# Patient Record
Sex: Male | Born: 1945 | Hispanic: No | Marital: Married | Smoking: Former smoker
Health system: Southern US, Community
[De-identification: ages and names within clinical notes are randomized; demographics above are authoritative.]

## PROBLEM LIST (undated history)

## (undated) DIAGNOSIS — M542 Cervicalgia: Secondary | ICD-10-CM

## (undated) DIAGNOSIS — I10 Essential (primary) hypertension: Secondary | ICD-10-CM

## (undated) DIAGNOSIS — G894 Chronic pain syndrome: Secondary | ICD-10-CM

## (undated) DIAGNOSIS — IMO0002 Reserved for concepts with insufficient information to code with codable children: Secondary | ICD-10-CM

## (undated) DIAGNOSIS — N529 Male erectile dysfunction, unspecified: Secondary | ICD-10-CM

## (undated) DIAGNOSIS — E785 Hyperlipidemia, unspecified: Secondary | ICD-10-CM

## (undated) DIAGNOSIS — K7689 Other specified diseases of liver: Secondary | ICD-10-CM

## (undated) DIAGNOSIS — K4090 Unilateral inguinal hernia, without obstruction or gangrene, not specified as recurrent: Secondary | ICD-10-CM

## (undated) DIAGNOSIS — K648 Other hemorrhoids: Secondary | ICD-10-CM

## (undated) DIAGNOSIS — G629 Polyneuropathy, unspecified: Secondary | ICD-10-CM

## (undated) DIAGNOSIS — H53009 Unspecified amblyopia, unspecified eye: Secondary | ICD-10-CM

## (undated) DIAGNOSIS — I639 Cerebral infarction, unspecified: Secondary | ICD-10-CM

## (undated) DIAGNOSIS — K5900 Constipation, unspecified: Secondary | ICD-10-CM

## (undated) DIAGNOSIS — K219 Gastro-esophageal reflux disease without esophagitis: Secondary | ICD-10-CM

## (undated) DIAGNOSIS — N433 Hydrocele, unspecified: Secondary | ICD-10-CM

## (undated) DIAGNOSIS — R42 Dizziness and giddiness: Secondary | ICD-10-CM

## (undated) DIAGNOSIS — K828 Other specified diseases of gallbladder: Secondary | ICD-10-CM

## (undated) DIAGNOSIS — G479 Sleep disorder, unspecified: Secondary | ICD-10-CM

## (undated) DIAGNOSIS — M549 Dorsalgia, unspecified: Secondary | ICD-10-CM

## (undated) DIAGNOSIS — H409 Unspecified glaucoma: Secondary | ICD-10-CM

## (undated) HISTORY — PX: LOBECTOMY: SHX5089

## (undated) HISTORY — DX: Other specified diseases of liver: K76.89

## (undated) HISTORY — DX: Other hemorrhoids: K64.8

## (undated) HISTORY — PX: NECK SURGERY: SHX720

## (undated) HISTORY — DX: Cerebral infarction, unspecified: I63.9

## (undated) HISTORY — DX: Gastro-esophageal reflux disease without esophagitis: K21.9

## (undated) HISTORY — PX: HEMORRHOID SURGERY: SHX153

## (undated) HISTORY — DX: Hydrocele, unspecified: N43.3

## (undated) HISTORY — DX: Chronic pain syndrome: G89.4

## (undated) HISTORY — DX: Unilateral inguinal hernia, without obstruction or gangrene, not specified as recurrent: K40.90

## (undated) HISTORY — DX: Essential (primary) hypertension: I10

## (undated) HISTORY — PX: LUMBAR DISC SURGERY: SHX700

## (undated) HISTORY — DX: Other specified diseases of gallbladder: K82.8

## (undated) HISTORY — DX: Sleep disorder, unspecified: G47.9

## (undated) HISTORY — DX: Reserved for concepts with insufficient information to code with codable children: IMO0002

## (undated) HISTORY — PX: COLONOSCOPY: SHX174

## (undated) HISTORY — PX: OTHER SURGICAL HISTORY: SHX169

## (undated) HISTORY — DX: Hyperlipidemia, unspecified: E78.5

## (undated) HISTORY — PX: CERVICAL DISC SURGERY: SHX588

## (undated) HISTORY — DX: Unspecified amblyopia, unspecified eye: H53.009

## (undated) HISTORY — DX: Polyneuropathy, unspecified: G62.9

## (undated) HISTORY — PX: BACK SURGERY: SHX140

## (undated) HISTORY — DX: Male erectile dysfunction, unspecified: N52.9

---

## 1998-01-11 ENCOUNTER — Ambulatory Visit (HOSPITAL_COMMUNITY): Admission: RE | Admit: 1998-01-11 | Discharge: 1998-01-11 | Payer: Self-pay | Admitting: Neurosurgery

## 1998-02-18 ENCOUNTER — Ambulatory Visit (HOSPITAL_COMMUNITY): Admission: RE | Admit: 1998-02-18 | Discharge: 1998-02-18 | Payer: Self-pay | Admitting: Neurosurgery

## 1998-02-21 ENCOUNTER — Inpatient Hospital Stay (HOSPITAL_COMMUNITY): Admission: RE | Admit: 1998-02-21 | Discharge: 1998-02-23 | Payer: Self-pay | Admitting: Neurosurgery

## 2004-04-20 ENCOUNTER — Ambulatory Visit: Payer: Self-pay | Admitting: Internal Medicine

## 2004-08-31 ENCOUNTER — Ambulatory Visit: Payer: Self-pay | Admitting: Family Medicine

## 2004-09-16 ENCOUNTER — Emergency Department (HOSPITAL_COMMUNITY): Admission: EM | Admit: 2004-09-16 | Discharge: 2004-09-16 | Payer: Self-pay | Admitting: Emergency Medicine

## 2004-10-13 ENCOUNTER — Ambulatory Visit (HOSPITAL_COMMUNITY): Admission: RE | Admit: 2004-10-13 | Discharge: 2004-10-13 | Payer: Self-pay | Admitting: Neurosurgery

## 2004-10-27 ENCOUNTER — Encounter: Admission: RE | Admit: 2004-10-27 | Discharge: 2004-10-27 | Payer: Self-pay | Admitting: Neurosurgery

## 2004-11-12 ENCOUNTER — Encounter: Admission: RE | Admit: 2004-11-12 | Discharge: 2004-11-12 | Payer: Self-pay | Admitting: Neurosurgery

## 2004-11-26 ENCOUNTER — Encounter: Admission: RE | Admit: 2004-11-26 | Discharge: 2004-11-26 | Payer: Self-pay | Admitting: Neurosurgery

## 2005-03-31 ENCOUNTER — Encounter: Admission: RE | Admit: 2005-03-31 | Discharge: 2005-03-31 | Payer: Self-pay | Admitting: Neurosurgery

## 2005-04-21 ENCOUNTER — Ambulatory Visit: Payer: Self-pay | Admitting: Family Medicine

## 2005-04-26 ENCOUNTER — Ambulatory Visit: Payer: Self-pay | Admitting: Family Medicine

## 2005-05-03 ENCOUNTER — Emergency Department (HOSPITAL_COMMUNITY): Admission: EM | Admit: 2005-05-03 | Discharge: 2005-05-03 | Payer: Self-pay | Admitting: Emergency Medicine

## 2005-05-12 ENCOUNTER — Inpatient Hospital Stay (HOSPITAL_COMMUNITY): Admission: AD | Admit: 2005-05-12 | Discharge: 2005-05-17 | Payer: Self-pay | Admitting: Neurosurgery

## 2005-10-06 ENCOUNTER — Encounter: Payer: Self-pay | Admitting: Neurosurgery

## 2005-10-27 IMAGING — CT CT CERVICAL SPINE W/ CM
3 of 25 series · 6 of 39 positions shown, 7 images · IV contrast (omnipaque)
Comparison: none

CLINICAL DATA: Neck and leg pain.  Clinical suspicion for L5-S1 radiculopathy on the right and C6 or C7 radiculopathy on the right (neck, right upper extremity, and right leg).  Prior plate and screw fixation at C5-6 and C6-7.
CERVICAL SPINE AND LUMBAR MYELOGRAPHY ? 10/13/04:
Dr. Denyse Pao instilled 10 cc of Omnipaque 300 into the subarachnoid space at the L5-S1 level.

[Series 2: cervical spine · axial · 0.26mm/px · z∈[-250,-250]mm · 1 of 81 slices shown, 2 images]
[im 1/81  soft-tissue]
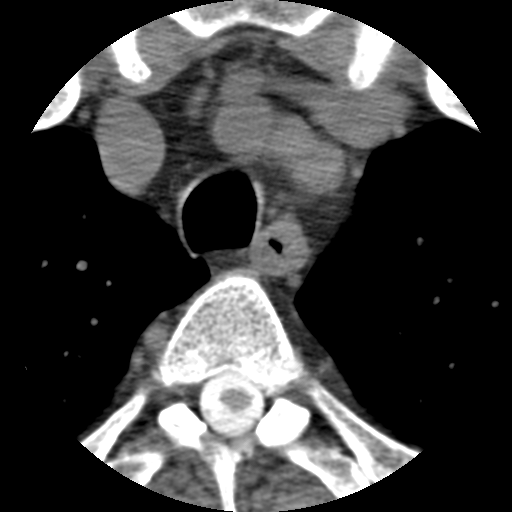
[im 1/81  bone]
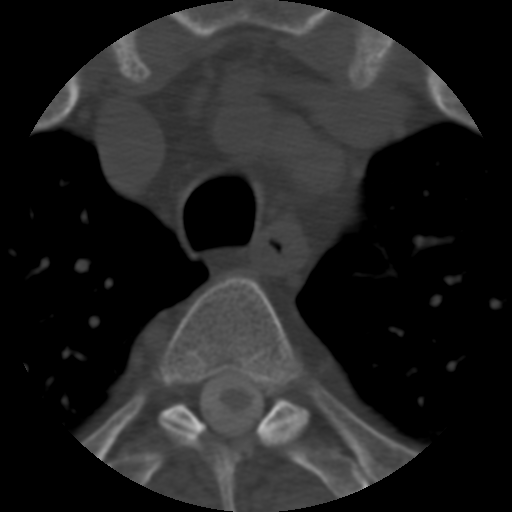

[Series 105: reformatted · coronal · 0.42mm/px · 1 of 49 slices shown (1 of 2)]
[im 25/49  bone]
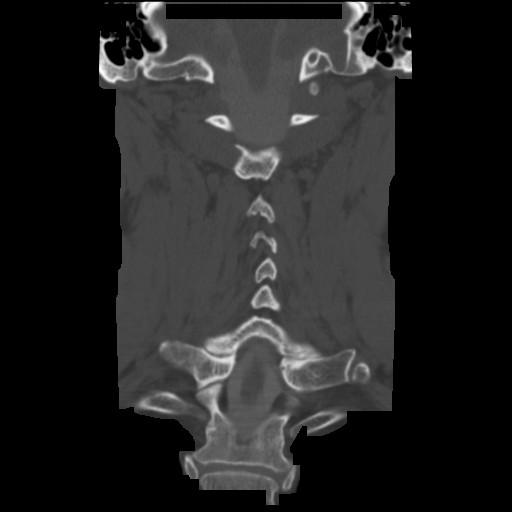

[Series 106: reformatted · sagittal · 0.62mm/px · 4 of 54 slices shown (2 of 2)]
[im 11/54  bone]
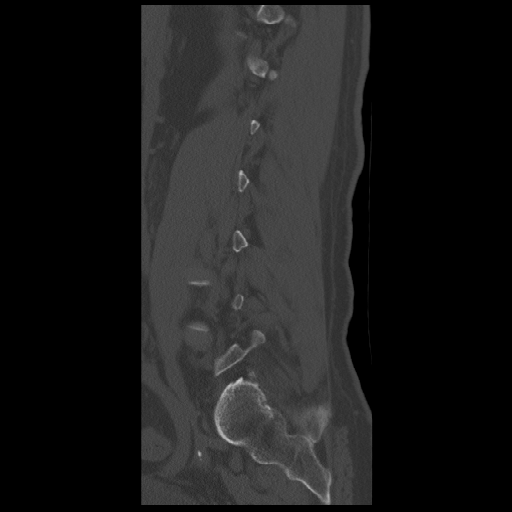
[im 22/54  bone]
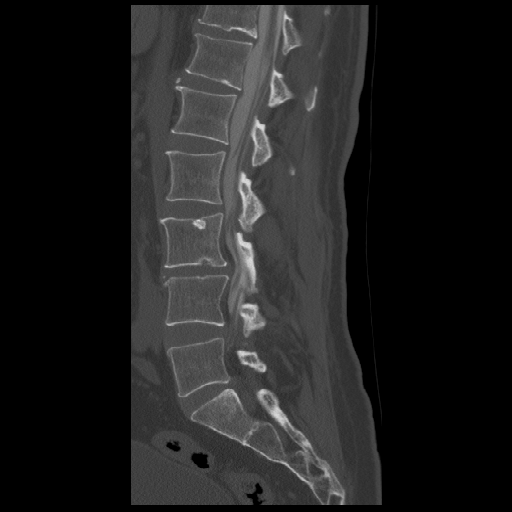
[im 32/54  bone]
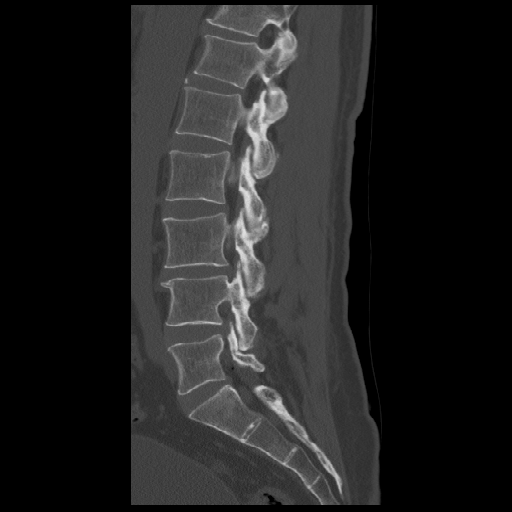
[im 43/54  bone]
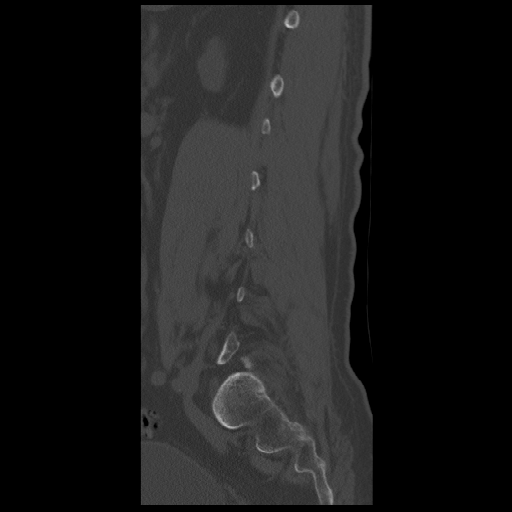

[6 of 39 positions shown; findings below may reference images not displayed]

FINDINGS: Extradural defects at L5-S1 bilaterally, mainly on the left, with mass effect on particularly the left L5 nerve root.  Prominent anterior extradural defect at L3-4 at the site of disk space narrowing and prominent osteophytes, particularly posteriorly.  Minimal anterolisthesis of L3 on L4 by 2-3 mm.  Also prominent extradural defect at L1-2 on the lateral view.  Addition to the extradural defects at L4-5, query mass effect on the S1 nerve roots, right greater than left.  
Plate and screw fixation hardware appears intact as does the interbody fusion at C5-6 and C6-7.  Mild anterior extradural defects at C3-4 and C4-5 due to spondylotic changes.
IMPRESSION: Stable appearance of plate and screw fixation and interbody fusion from C5 to C7.  
Anterior extradural defects in the cervical and lumbar regions as described above.
Prominent extradural defects at L4-5 and to a lesser degree at L5-S1.  
Post-myelogram CT to follow.
POST-MYELOGRAM CT SCAN OF THE CERVICAL SPINE WITH CONTRAST:
Following the myelogram, transaxial cuts were acquired from the occiput to T3 level.  From the axial data set, images were reconstructed in the coronal and sagittal planes.
FINDINGS: Solid appearing interbody fusions at C5-6 and C6-7.  Unremarkable appearance of anterior plate and screw fixation at C5, C6, and C7.  No unusual bony rarefaction or about the screws.  Degenerative disk disease changes at C4-5.  Anterior osteophytic formation and calcified annulus at C7-T1 anteriorly.  Foraminal stenosis on the right at C3-4 secondary to uncinate process and facet hypertrophy.  Similar etiology for foraminal stenosis on the right at C4-5 and on the left at C5-6, and to a lesser degree on the left at C6-7.  Small hemilaminotomy defect I believe on the left at C5-6.  Mild focal hypertrophic bony changes midline and posterolateral left at C5-6.  Diffusely degenerative fact arthropathic changes with facet hypertrophy, mainly on the right at C3-4 and C4-5.
Suspicion for HNP posterolateral on the right at C3-4 possibly affecting the right C4 nerve root.
IMPRESSION: Multiple sites of foraminal stenosis as described above.  
Suspicion for HNP in the lateral recess on the right at C3-4.
POST-MYELOGRAM CT SCAN OF THE LUMBAR SPINE WITH CONTRAST (Intrathecal Omnipaque 300):
Transaxial cuts were acquired from T12 to S1 and from the axial data set images were reconstructed in coronal and sagittal planes.
FINDINGS: Eccentric annular bulging posterolaterally on the left at L1-2.  Mild central stenosis at that level.  
At L2-3, there is mild annular bulging mainly on the left posterolaterally and laterally with encroachment on the left L2 nerve root.  
Mild to moderate stenosis of the central canal at L2-3.  
At L3-4, there is minimal anterolisthesis of L3 on L4 and mild focal kyphosis.  Broad based shallow disk herniation paracentral and posterolateral on the right at L3-4 encroaching on the right L3 but particularly the right L4 nerve roots.  Laminectomy defect at L3-4.
At L4-5, there is diffuse annular bulging and hypertrophy of facets and ligamentum flavum leading to marked stenosis of the central canal and lateral recesses.  Compression on the foramina, particularly on the left with a broad based HNP mainly posterolateral and lateral on the left.  Encroachment on the left 4 nerve root within and just lateral to the foramen. 
Advanced arthropathic changes.  
At L5-S1, there is an HNP paracentral and slightly posterolateral on the right with mass effect on the right S1 nerve root.  There may be a free disk fragment extending downward into the right S1 lateral recess.
IMPRESSION: HNP on the right at L5-S1.
Stenotic changes at L4-5 and an HNP posterolateral on the left at L4-5.
Moderately small shallow HNP central, paracentral, and posterolateral on the right at L3-4. 
Eccentric annular bulge/shallow subligamentous protrusion lateral on the left at L2-3.
Shallow eccentric bulge posterolateral/lateral on the left at L1-2.
Minimal posterolateral HNP on the right at T12-L1.

## 2006-01-18 ENCOUNTER — Ambulatory Visit: Payer: Self-pay | Admitting: Family Medicine

## 2006-02-02 ENCOUNTER — Encounter: Admission: RE | Admit: 2006-02-02 | Discharge: 2006-02-02 | Payer: Self-pay | Admitting: Neurosurgery

## 2006-02-10 ENCOUNTER — Ambulatory Visit: Payer: Self-pay | Admitting: Family Medicine

## 2006-03-30 ENCOUNTER — Ambulatory Visit (HOSPITAL_COMMUNITY): Admission: RE | Admit: 2006-03-30 | Discharge: 2006-03-30 | Payer: Self-pay | Admitting: Infectious Diseases

## 2006-04-22 ENCOUNTER — Ambulatory Visit: Payer: Self-pay | Admitting: Family Medicine

## 2006-04-22 LAB — CONVERTED CEMR LAB
AST: 23 units/L (ref 0–37)
Albumin: 3.7 g/dL (ref 3.5–5.2)
Alkaline Phosphatase: 62 units/L (ref 39–117)
BUN: 10 mg/dL (ref 6–23)
Calcium: 9.2 mg/dL (ref 8.4–10.5)
Chol/HDL Ratio, serum: 2.8
Cholesterol: 141 mg/dL (ref 0–200)
Creatinine, Ser: 1.1 mg/dL (ref 0.4–1.5)
Eosinophil percent: 1.8 % (ref 0.0–5.0)
HCT: 44.1 % (ref 39.0–52.0)
Hemoglobin: 15.1 g/dL (ref 13.0–17.0)
Lymphocytes Relative: 26 % (ref 12.0–46.0)
Neutrophils Relative %: 65.6 % (ref 43.0–77.0)
PSA: 1.18 ng/mL (ref 0.10–4.00)
Potassium: 4.1 meq/L (ref 3.5–5.1)
RDW: 12.3 % (ref 11.5–14.6)
TSH: 2.96 microintl units/mL (ref 0.35–5.50)
Total Bilirubin: 1.5 mg/dL — ABNORMAL HIGH (ref 0.3–1.2)
Total Protein: 6.8 g/dL (ref 6.0–8.3)
Triglyceride fasting, serum: 72 mg/dL (ref 0–149)

## 2006-05-06 ENCOUNTER — Ambulatory Visit: Payer: Self-pay | Admitting: Family Medicine

## 2006-05-11 ENCOUNTER — Ambulatory Visit: Payer: Self-pay

## 2006-05-13 ENCOUNTER — Ambulatory Visit: Payer: Self-pay | Admitting: Family Medicine

## 2006-11-29 ENCOUNTER — Ambulatory Visit: Payer: Self-pay | Admitting: Family Medicine

## 2007-03-07 DIAGNOSIS — I10 Essential (primary) hypertension: Secondary | ICD-10-CM

## 2007-04-24 ENCOUNTER — Ambulatory Visit: Payer: Self-pay | Admitting: Family Medicine

## 2007-04-24 LAB — CONVERTED CEMR LAB
ALT: 31 units/L (ref 0–53)
Bilirubin Urine: NEGATIVE
Bilirubin, Direct: 0.2 mg/dL (ref 0.0–0.3)
Calcium: 9.1 mg/dL (ref 8.4–10.5)
Cholesterol: 138 mg/dL (ref 0–200)
Eosinophils Absolute: 0.1 10*3/uL (ref 0.0–0.6)
Eosinophils Relative: 2.1 % (ref 0.0–5.0)
GFR calc Af Amer: 97 mL/min
GFR calc non Af Amer: 80 mL/min
Glucose, Bld: 92 mg/dL (ref 70–99)
Glucose, Urine, Semiquant: NEGATIVE
HDL: 41.5 mg/dL (ref >39.0)
Ketones, urine, test strip: NEGATIVE
Lymphocytes Relative: 27.6 % (ref 12.0–46.0)
MCHC: 34.4 g/dL (ref 30.0–36.0)
MCV: 91.6 fL (ref 78.0–100.0)
Neutro Abs: 3.5 10*3/uL (ref 1.4–7.7)
PSA: 1.21 ng/mL (ref 0.10–4.00)
Platelets: 133 10*3/uL — ABNORMAL LOW (ref 150–400)
Sodium: 142 meq/L (ref 135–145)
Specific Gravity, Urine: 1.02
Triglycerides: 86 mg/dL (ref 0–149)
WBC: 5.5 10*3/uL (ref 4.5–10.5)
pH: 6

## 2007-05-01 ENCOUNTER — Ambulatory Visit: Payer: Self-pay | Admitting: Family Medicine

## 2007-05-01 DIAGNOSIS — E782 Mixed hyperlipidemia: Secondary | ICD-10-CM

## 2007-05-01 DIAGNOSIS — M549 Dorsalgia, unspecified: Secondary | ICD-10-CM | POA: Insufficient documentation

## 2007-05-01 DIAGNOSIS — F528 Other sexual dysfunction not due to a substance or known physiological condition: Secondary | ICD-10-CM

## 2007-05-09 ENCOUNTER — Encounter: Payer: Self-pay | Admitting: Family Medicine

## 2007-05-10 ENCOUNTER — Telehealth: Payer: Self-pay | Admitting: Family Medicine

## 2007-06-14 ENCOUNTER — Telehealth: Payer: Self-pay | Admitting: Family Medicine

## 2007-10-24 ENCOUNTER — Ambulatory Visit: Payer: Self-pay | Admitting: Family Medicine

## 2007-10-24 LAB — CONVERTED CEMR LAB
ALT: 45 units/L (ref 0–53)
AST: 26 units/L (ref 0–37)
Albumin: 4 g/dL (ref 3.5–5.2)
BUN: 11 mg/dL (ref 6–23)
Basophils Relative: 0.3 % (ref 0.0–1.0)
CO2: 32 meq/L (ref 19–32)
Chloride: 108 meq/L (ref 96–112)
Creatinine, Ser: 1 mg/dL (ref 0.4–1.5)
Eosinophils Relative: 0.8 % (ref 0.0–5.0)
H Pylori IgG: NEGATIVE
Lymphocytes Relative: 16.8 % (ref 12.0–46.0)
Neutrophils Relative %: 76.5 % (ref 43.0–77.0)
RBC: 4.92 M/uL (ref 4.22–5.81)
WBC: 8.5 10*3/uL (ref 4.5–10.5)

## 2007-10-25 ENCOUNTER — Telehealth: Payer: Self-pay | Admitting: Family Medicine

## 2007-10-25 ENCOUNTER — Emergency Department (HOSPITAL_COMMUNITY): Admission: EM | Admit: 2007-10-25 | Discharge: 2007-10-25 | Payer: Self-pay | Admitting: Emergency Medicine

## 2007-10-26 ENCOUNTER — Ambulatory Visit: Payer: Self-pay | Admitting: Internal Medicine

## 2007-10-26 DIAGNOSIS — K5730 Diverticulosis of large intestine without perforation or abscess without bleeding: Secondary | ICD-10-CM | POA: Insufficient documentation

## 2007-10-27 ENCOUNTER — Encounter: Payer: Self-pay | Admitting: Internal Medicine

## 2007-10-27 ENCOUNTER — Ambulatory Visit (HOSPITAL_COMMUNITY): Admission: RE | Admit: 2007-10-27 | Discharge: 2007-10-27 | Payer: Self-pay | Admitting: Internal Medicine

## 2007-10-31 ENCOUNTER — Telehealth: Payer: Self-pay | Admitting: Internal Medicine

## 2007-10-31 ENCOUNTER — Ambulatory Visit: Payer: Self-pay | Admitting: Internal Medicine

## 2007-11-03 ENCOUNTER — Telehealth: Payer: Self-pay | Admitting: Internal Medicine

## 2007-11-03 ENCOUNTER — Ambulatory Visit: Payer: Self-pay | Admitting: Internal Medicine

## 2007-11-07 ENCOUNTER — Telehealth: Payer: Self-pay | Admitting: Internal Medicine

## 2007-11-08 ENCOUNTER — Ambulatory Visit (HOSPITAL_COMMUNITY): Admission: RE | Admit: 2007-11-08 | Discharge: 2007-11-08 | Payer: Self-pay | Admitting: Internal Medicine

## 2007-11-13 ENCOUNTER — Ambulatory Visit: Payer: Self-pay | Admitting: Internal Medicine

## 2007-11-14 ENCOUNTER — Encounter: Payer: Self-pay | Admitting: Internal Medicine

## 2007-11-14 ENCOUNTER — Ambulatory Visit: Payer: Self-pay | Admitting: Internal Medicine

## 2007-11-16 ENCOUNTER — Encounter: Payer: Self-pay | Admitting: Internal Medicine

## 2008-05-06 ENCOUNTER — Telehealth: Payer: Self-pay | Admitting: Family Medicine

## 2008-05-14 ENCOUNTER — Telehealth (INDEPENDENT_AMBULATORY_CARE_PROVIDER_SITE_OTHER): Payer: Self-pay | Admitting: *Deleted

## 2008-06-12 ENCOUNTER — Ambulatory Visit: Payer: Self-pay | Admitting: Family Medicine

## 2008-06-12 LAB — CONVERTED CEMR LAB
ALT: 35 units/L (ref 0–53)
AST: 29 units/L (ref 0–37)
Albumin: 3.8 g/dL (ref 3.5–5.2)
Alkaline Phosphatase: 63 units/L (ref 39–117)
BUN: 18 mg/dL (ref 6–23)
Bilirubin, Direct: 0.1 mg/dL (ref 0.0–0.3)
Blood in Urine, dipstick: NEGATIVE
CO2: 32 meq/L (ref 19–32)
Chloride: 106 meq/L (ref 96–112)
Eosinophils Relative: 2.1 % (ref 0.0–5.0)
Glucose, Bld: 101 mg/dL — ABNORMAL HIGH (ref 70–99)
HCT: 41.6 % (ref 39.0–52.0)
HDL: 49.5 mg/dL (ref >39.0)
LDL Cholesterol: 106 mg/dL — ABNORMAL HIGH (ref 0–99)
Lymphocytes Relative: 27 % (ref 12.0–46.0)
Monocytes Absolute: 0.5 10*3/uL (ref 0.1–1.0)
Monocytes Relative: 8.4 % (ref 3.0–12.0)
Neutrophils Relative %: 62.2 % (ref 43.0–77.0)
Nitrite: NEGATIVE
Platelets: 134 10*3/uL — ABNORMAL LOW (ref 150–400)
Potassium: 4.3 meq/L (ref 3.5–5.1)
Specific Gravity, Urine: 1.025
Total CHOL/HDL Ratio: 3.5
Total Protein: 7 g/dL (ref 6.0–8.3)
WBC Urine, dipstick: NEGATIVE
WBC: 5.9 10*3/uL (ref 4.5–10.5)

## 2008-06-17 ENCOUNTER — Ambulatory Visit: Payer: Self-pay | Admitting: Family Medicine

## 2008-10-07 ENCOUNTER — Ambulatory Visit: Payer: Self-pay | Admitting: Family Medicine

## 2009-01-02 ENCOUNTER — Telehealth: Payer: Self-pay | Admitting: Family Medicine

## 2009-06-10 ENCOUNTER — Ambulatory Visit: Payer: Self-pay | Admitting: Family Medicine

## 2009-06-10 DIAGNOSIS — G608 Other hereditary and idiopathic neuropathies: Secondary | ICD-10-CM

## 2009-09-04 ENCOUNTER — Telehealth: Payer: Self-pay | Admitting: Family Medicine

## 2009-12-17 ENCOUNTER — Encounter: Admission: RE | Admit: 2009-12-17 | Discharge: 2009-12-17 | Payer: Self-pay | Admitting: Neurology

## 2010-01-12 ENCOUNTER — Encounter: Admission: RE | Admit: 2010-01-12 | Discharge: 2010-01-12 | Payer: Self-pay | Admitting: Infectious Diseases

## 2010-05-04 ENCOUNTER — Ambulatory Visit (HOSPITAL_COMMUNITY)
Admission: RE | Admit: 2010-05-04 | Discharge: 2010-05-04 | Payer: Self-pay | Source: Home / Self Care | Admitting: Anesthesiology

## 2010-06-12 ENCOUNTER — Ambulatory Visit
Admission: RE | Admit: 2010-06-12 | Discharge: 2010-06-12 | Payer: Self-pay | Source: Home / Self Care | Attending: Family Medicine | Admitting: Family Medicine

## 2010-06-12 ENCOUNTER — Other Ambulatory Visit: Payer: Self-pay | Admitting: Family Medicine

## 2010-06-12 ENCOUNTER — Encounter: Payer: Self-pay | Admitting: Family Medicine

## 2010-06-12 LAB — HEPATIC FUNCTION PANEL
ALT: 53 U/L (ref 0–53)
AST: 35 U/L (ref 0–37)
Albumin: 3.9 g/dL (ref 3.5–5.2)
Alkaline Phosphatase: 65 U/L (ref 39–117)
Bilirubin, Direct: 0.2 mg/dL (ref 0.0–0.3)
Total Bilirubin: 1.6 mg/dL — ABNORMAL HIGH (ref 0.3–1.2)
Total Protein: 7.1 g/dL (ref 6.0–8.3)

## 2010-06-12 LAB — LIPID PANEL
Cholesterol: 172 mg/dL (ref 0–200)
HDL: 52.7 mg/dL (ref >39.00)
LDL Cholesterol: 99 mg/dL (ref 0–99)
Total CHOL/HDL Ratio: 3
Triglycerides: 101 mg/dL (ref 0.0–149.0)
VLDL: 20.2 mg/dL (ref 0.0–40.0)

## 2010-06-12 LAB — BASIC METABOLIC PANEL
BUN: 15 mg/dL (ref 6–23)
CO2: 31 mEq/L (ref 19–32)
Calcium: 9.3 mg/dL (ref 8.4–10.5)
Chloride: 102 mEq/L (ref 96–112)
Creatinine, Ser: 1.1 mg/dL (ref 0.4–1.5)
GFR: 72.56 mL/min (ref >60.00)
Glucose, Bld: 89 mg/dL (ref 70–99)
Potassium: 4.3 mEq/L (ref 3.5–5.1)
Sodium: 141 mEq/L (ref 135–145)

## 2010-06-12 LAB — CBC WITH DIFFERENTIAL/PLATELET
Basophils Absolute: 0 10*3/uL (ref 0.0–0.1)
Basophils Relative: 0.2 % (ref 0.0–3.0)
Eosinophils Absolute: 0.1 10*3/uL (ref 0.0–0.7)
Eosinophils Relative: 1.5 % (ref 0.0–5.0)
HCT: 44 % (ref 39.0–52.0)
Hemoglobin: 15.1 g/dL (ref 13.0–17.0)
Lymphocytes Relative: 26.4 % (ref 12.0–46.0)
Lymphs Abs: 2.2 10*3/uL (ref 0.7–4.0)
MCHC: 34.3 g/dL (ref 30.0–36.0)
MCV: 93 fl (ref 78.0–100.0)
Monocytes Absolute: 0.6 10*3/uL (ref 0.1–1.0)
Monocytes Relative: 6.7 % (ref 3.0–12.0)
Neutro Abs: 5.5 10*3/uL (ref 1.4–7.7)
Neutrophils Relative %: 65.2 % (ref 43.0–77.0)
Platelets: 152 10*3/uL (ref 150.0–400.0)
RBC: 4.74 Mil/uL (ref 4.22–5.81)
RDW: 13.1 % (ref 11.5–14.6)
WBC: 8.4 10*3/uL (ref 4.5–10.5)

## 2010-06-12 LAB — PSA: PSA: 1.16 ng/mL (ref 0.10–4.00)

## 2010-06-12 LAB — TSH: TSH: 2.06 u[IU]/mL (ref 0.35–5.50)

## 2010-06-28 ENCOUNTER — Encounter: Payer: Self-pay | Admitting: Neurology

## 2010-07-05 LAB — CONVERTED CEMR LAB
ALT: 31 units/L (ref 0–53)
AST: 25 units/L (ref 0–37)
Alkaline Phosphatase: 59 units/L (ref 39–117)
BUN: 16 mg/dL (ref 6–23)
Basophils Absolute: 0 10*3/uL (ref 0.0–0.1)
Calcium: 9.4 mg/dL (ref 8.4–10.5)
Eosinophils Relative: 3.1 % (ref 0.0–5.0)
GFR calc non Af Amer: 64.45 mL/min (ref >60)
Glucose, Bld: 91 mg/dL (ref 70–99)
HCT: 43.4 % (ref 39.0–52.0)
HDL: 56.5 mg/dL (ref >39.00)
Hemoglobin: 14.4 g/dL (ref 13.0–17.0)
LDL Cholesterol: 99 mg/dL (ref 0–99)
Lymphocytes Relative: 23.4 % (ref 12.0–46.0)
Lymphs Abs: 1.5 10*3/uL (ref 0.7–4.0)
Monocytes Relative: 6.4 % (ref 3.0–12.0)
Platelets: 133 10*3/uL — ABNORMAL LOW (ref 150.0–400.0)
Potassium: 3.8 meq/L (ref 3.5–5.1)
RDW: 13 % (ref 11.5–14.6)
Sodium: 144 meq/L (ref 135–145)
Total Bilirubin: 1.5 mg/dL — ABNORMAL HIGH (ref 0.3–1.2)
Urobilinogen, UA: 1
VLDL: 21.8 mg/dL (ref 0.0–40.0)
WBC Urine, dipstick: NEGATIVE
WBC: 6.5 10*3/uL (ref 4.5–10.5)

## 2010-07-09 NOTE — Progress Notes (Signed)
Summary: refill  Phone Note From Pharmacy   Summary of Call: rx clairification Initial call taken by: Kern Reap CMA Duncan Dull),  September 04, 2009 11:51 AM    New/Updated Medications: BENAZEPRIL-HYDROCHLOROTHIAZIDE 20-12.5 MG TABS (BENAZEPRIL-HYDROCHLOROTHIAZIDE) take one tab once daily Prescriptions: BENAZEPRIL-HYDROCHLOROTHIAZIDE 20-12.5 MG TABS (BENAZEPRIL-HYDROCHLOROTHIAZIDE) take one tab once daily  #90 x 3   Entered by:   Kern Reap CMA (AAMA)   Authorized by:   Roderick Pee MD   Signed by:   Kern Reap CMA (AAMA) on 09/04/2009   Method used:   Electronically to        [facility]. [phone]* (retail)       [address]       [place], [place]  [address]       Ph: [phone]       Fax: [phone]   RxID:   [id number]

## 2010-07-09 NOTE — Assessment & Plan Note (Signed)
Summary: emp/pt coming in fasting/cjr   Vital Signs:  Patient profile:   65 year old male Height:      70.25 inches Weight:      200 pounds BMI:     28.60 Temp:     98.1 degrees F oral BP sitting:   110 / 80  (left arm) Cuff size:   regular  Vitals Entered By: Kern Reap CMA Duncan Dull) (June 12, 2010 9:53 AM) CC: wellness exam Is Patient Diabetic? No Pain Assessment Patient in pain? no        Primary Care Provider:  Kelle Darting MD  CC:  wellness exam.  History of Present Illness: Kurt Parsons is a 65 year old, married male, nonsmoker, who comes in today for Medicare wellness examination.  He takes Celexa 20 mg nightly for mild depression, Zocor 20 milligrams nightly for hyperlipidemia, and lisinopril 40 mg q.a.m. along with hydrochlorothiazide 25 mg q.a.m. for mild hypertension.  BP 110/80, Ambien, 10 mg nightly for sleep, and, under the direction of Dr. Vear Clock.  His pain doctor.  He takes gabapentin 800 mg, and he's been able to decrease the dose to twice daily.  He has an external stimulator.  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  This was done at [facility].  He also takes Prilosec 40 mg daily.  Tetanus 2007, Pneumovax 2012, seasonal flu 2011,  Here for Medicare AWV:  1.   Risk factors based on Past M, S, F history:..reviewed.  No changes except for above 2.   Physical Activities: walks daily 3.   Depression/mood: good mood.  No depression 4.   Hearing: normal.  Hearing 5.   ADL's: functions independently 6.   Fall Risk: we do not identified 7.   Home Safety: no guns in the house 8.   Height, weight, &visual acuity:height weight, normal.  Vision normal 9.   Counseling: continue medical treatment 10.   Labs ordered based on risk factors: done today 11.           Referral Coordination...Marland KitchenMarland KitchenMarland Kitchennone indicated 12.           Care Plan.......continue follow-up with Dr. Vear Clock at the pain clinic 13.            Cognitive Assessment  ......Marland Kitchenoriented x 3 financially independent volunteers at [facility]  Allergies (verified): No Known Drug Allergies  Past History:  Past medical, surgical, family and social histories (including risk factors) reviewed, and no changes noted (except as noted below).  Past Medical History: Reviewed history from 10/26/2007 and no changes required. Lazy Eye - R High Cholesterol ED Sleep Disturbance Hypertension Hemorrhoids cervical disk surgery x 2 lumbar disk surgery x 2 chronic pain syndrome Diverticulosis  Past Surgical History: Reviewed history from 10/26/2007 and no changes required. Colonoscopy Neck Surgery Back Surgery Hemorrhoid Surrgery 1980  Family History: Reviewed history from 03/07/2007 and no changes required. Family History Diabetes 1st degree relative Family History Hypertension Family History of Stroke M 1st degree relative <50 Family History of Cardiovascular disorder Family History of Respiratory disease  Social History: Reviewed history from 10/26/2007 and no changes required. Former Smoker:  Quit 1976 Drug use-no Married Regular exercise-no Occupation: Retired Alcohol Use - no Daily Caffeine Use:  1 cup tea Illicit Drug Use - no  Physical Exam  General:  Well-developed,well-nourished,in no acute distress; alert,appropriate and cooperative throughout examination Head:  Normocephalic and atraumatic without obvious abnormalities. No apparent alopecia or balding. Eyes:  No corneal  or conjunctival inflammation noted. EOMI. Perrla. Funduscopic exam benign, without hemorrhages, exudates or papilledema. Vision grossly normal. Ears:  External ear exam shows no significant lesions or deformities.  Otoscopic examination reveals clear canals, tympanic membranes are intact bilaterally without bulging, retraction, inflammation or discharge. Hearing is grossly normal bilaterally. Nose:  External nasal examination shows no deformity or inflammation. Nasal  mucosa are pink and moist without lesions or exudates. Mouth:  Oral mucosa and oropharynx without lesions or exudates.  Teeth in good repair. Neck:  No deformities, masses, or tenderness noted. Chest Wall:  No deformities, masses, tenderness or gynecomastia noted. Breasts:  No masses or gynecomastia noted Lungs:  Normal respiratory effort, chest expands symmetrically. Lungs are clear to auscultation, no crackles or wheezes. Heart:  Normal rate and regular rhythm. S1 and S2 normal without gallop, murmur, click, rub or other extra sounds. Abdomen:  Bowel sounds positive,abdomen soft and non-tender without masses, organomegaly or hernias noted. Rectal:  No external abnormalities noted. Normal sphincter tone. No rectal masses or tenderness. Genitalia:  Testes bilaterally descended without nodularity, tenderness or masses. No scrotal masses or lesions. No penis lesions or urethral discharge. Prostate:  Prostate gland firm and smooth, no enlargement, nodularity, tenderness, mass, asymmetry or induration. Msk:  No deformity or scoliosis noted of thoracic or lumbar spine.   Pulses:  R and L carotid,radial,femoral,dorsalis pedis and posterior tibial pulses are full and equal bilaterally Extremities:  No clubbing, cyanosis, edema, or deformity noted with normal full range of motion of all joints.   Neurologic:  No cranial nerve deficits noted. Station and gait are normal. Plantar reflexes are down-going bilaterally. DTRs are symmetrical throughout. Sensory, motor and coordinative functions appear intact. Skin:  Intact without suspicious lesions or rashes Cervical Nodes:  No lymphadenopathy noted Axillary Nodes:  No palpable lymphadenopathy Inguinal Nodes:  No significant adenopathy Psych:  Cognition and judgment appear intact. Alert and cooperative with normal attention span and concentration. No apparent delusions, illusions, hallucinations   Impression & Recommendations:  Problem # 1:  MIXED  HYPERLIPIDEMIA (ICD-272.2) Assessment Improved  His updated medication list for this problem includes:    Zocor 20 Mg Tabs (Simvastatin) .Marland Kitchen... Take 1 tablet by mouth once a day  Orders: Prescription Created Electronically [phone]) Medicare -1st Annual Wellness Visit [phone]) Urinalysis-dipstick only (Medicare patient) [phone]) Venipuncture (47829) EKG w/ Interpretation (93000) TLB-Lipid Panel (80061-LIPID) TLB-BMP (Basic Metabolic Panel-BMET) (80048-METABOL) TLB-CBC Platelet - w/Differential (85025-CBCD) TLB-Hepatic/Liver Function Pnl (80076-HEPATIC) TLB-TSH (Thyroid Stimulating Hormone) (84443-TSH) TLB-PSA (Prostate Specific Antigen) (84153-PSA)  Problem # 2:  HYPERTENSION (ICD-401.9) Assessment: Improved  The following medications were removed from the medication list:    Benazepril-hydrochlorothiazide 20-12.5 Mg Tabs (Benazepril-hydrochlorothiazide) .Marland Kitchen... Take one tab once daily His updated medication list for this problem includes:    Lisinopril 40 Mg Tabs (Lisinopril) .Marland Kitchen... Take one tab once daily    Hydrochlorothiazide 25 Mg Tabs (Hydrochlorothiazide) .Marland Kitchen... Take one tablet daily  Orders: Prescription Created Electronically [phone]) Medicare -1st Annual Wellness Visit [phone]) Urinalysis-dipstick only (Medicare patient) [phone]) Venipuncture (52841) TLB-Lipid Panel (80061-LIPID) TLB-BMP (Basic Metabolic Panel-BMET) (80048-METABOL) TLB-CBC Platelet - w/Differential (85025-CBCD) TLB-Hepatic/Liver Function Pnl (80076-HEPATIC) TLB-TSH (Thyroid Stimulating Hormone) (84443-TSH) TLB-PSA (Prostate Specific Antigen) (84153-PSA)  Problem # 3:  PHYSICAL EXAMINATION (ICD-V70.0) Assessment: Unchanged  Orders: Prescription Created Electronically [phone]) Medicare -1st Annual Wellness Visit [phone]) Urinalysis-dipstick only (Medicare patient) (53664QI) Venipuncture (34742) EKG w/ Interpretation (93000) TLB-Lipid Panel (80061-LIPID) TLB-BMP (Basic Metabolic Panel-BMET)  (80048-METABOL) TLB-CBC Platelet - w/Differential (85025-CBCD) TLB-Hepatic/Liver Function Pnl (80076-HEPATIC) TLB-TSH (Thyroid Stimulating Hormone) (84443-TSH) TLB-PSA (  Prostate Specific Antigen) (84153-PSA)  Complete Medication List: 1)  Zocor 20 Mg Tabs (Simvastatin) .... Take 1 tablet by mouth once a day 2)  Celexa 20 Mg Tabs (Citalopram hydrobromide) .... Take 1 tablet by mouth once a day 3)  Gabapentin 800 Mg Tabs (Gabapentin) .... Take 1 tablet by mouth 2  times a day 4)  Ambien 10 Mg Tabs (Zolpidem tartrate) .Marland Kitchen.. 1 tab @ bedtime 5)  Lisinopril 40 Mg Tabs (Lisinopril) .... Take one tab once daily 6)  Hydrochlorothiazide 25 Mg Tabs (Hydrochlorothiazide) .... Take one tablet daily 7)  Omeprazole 40 Mg Cpdr (Omeprazole) .... Take one tab once daily 8)  Vitamin B-6 100 Mg Tabs (Pyridoxine hcl) .... Once daily 9)  Isoniazid 300 Mg Tabs (Isoniazid) .... Every morning x 9 m0  Other Orders: Pneumococcal Vaccine (04540) Admin 1st Vaccine (98119)  Patient Instructions: 1)  Please schedule a follow-up appointment in 1 year. Prescriptions: AMBIEN 10 MG TABS (ZOLPIDEM TARTRATE) 1 tab @ bedtime  #100 x 3   Entered and Authorized by:   Roderick Pee MD   Signed by:   Roderick Pee MD on 06/12/2010   Method used:   Print then Give to Patient   RxID:   [id number] HYDROCHLOROTHIAZIDE 25 MG TABS (HYDROCHLOROTHIAZIDE) take one tablet daily  #100 x 4   Entered and Authorized by:   Roderick Pee MD   Signed by:   Roderick Pee MD on 06/12/2010   Method used:   Electronically to        [facility]. [phone]* (retail)       [address]       [place], [place]  [address]       Ph: [phone]       Fax: [phone]   RxID:   [id number] LISINOPRIL 40 MG TABS (LISINOPRIL) take one tab once daily  #100 x 4   Entered and Authorized by:   Roderick Pee MD   Signed by:   Roderick Pee MD on 06/12/2010   Method used:   Electronically to         [facility]. [phone]* (retail)       [address]       [place], [place]  [address]       Ph: [phone]       Fax: [phone]   RxID:   [id number] GABAPENTIN 800 MG  TABS (GABAPENTIN) Take 1 tablet by mouth 2  times a day  #200 x 3   Entered and Authorized by:   Roderick Pee MD   Signed by:   Roderick Pee MD on 06/12/2010   Method used:   Electronically to        [facility]. [phone]* (retail)       [address]       [place], [place]  [address]       Ph: [phone]       Fax: [phone]   RxID:   [id number] CELEXA 20 MG  TABS (CITALOPRAM HYDROBROMIDE) Take 1 tablet by mouth once a day  #100 x 3   Entered and Authorized by:   Roderick Pee MD   Signed by:   Roderick Pee MD on 06/12/2010   Method used:   Electronically to  [facility]. [phone]* (retail)       [address]       [place], [place]  [address]       Ph: [phone]       Fax: [phone]   RxID:   [id number] ZOCOR 20 MG  TABS (SIMVASTATIN) Take 1 tablet by mouth once a day  #100 x 3   Entered and Authorized by:   Roderick Pee MD   Signed by:   Roderick Pee MD on 06/12/2010   Method used:   Electronically to        [facility]. [phone]* (retail)       [address]       [place], [place]  [address]       Ph: [phone]       Fax: [phone]   RxID:   [id number]    Orders Added: 1)  Prescription Created Electronically [phone] 2)  Medicare -1st Annual Wellness Visit [G0438] 3)  Urinalysis-dipstick only (Medicare patient) [[id number]] 4)  Venipuncture [93235] 5)  EKG w/ Interpretation [93000] 6)  Pneumococcal Vaccine [90732] 7)  Admin 1st Vaccine [90471] 8)  TLB-Lipid Panel [80061-LIPID] 9)  TLB-BMP (Basic Metabolic Panel-BMET) [80048-METABOL] 10)  TLB-CBC Platelet - w/Differential [85025-CBCD] 11)   TLB-Hepatic/Liver Function Pnl [80076-HEPATIC] 12)  TLB-TSH (Thyroid Stimulating Hormone) [84443-TSH] 13)  TLB-PSA (Prostate Specific Antigen) [57322-GUR]   Immunization History:  Influenza Immunization History:    Influenza:  historical (03/07/2010)  Immunizations Administered:  Pneumonia Vaccine:    Vaccine Type: Pneumovax    Site: left deltoid    Mfr: Merck    Dose: 0.5 ml    Route: IM    Given by: Kern Reap CMA (AAMA)    Exp. Date: 10/07/2011    Lot #: [id number]    VIS given: 05/12/09 version given June 12, 2010.   Immunization History:  Influenza Immunization History:    Influenza:  Historical (03/07/2010)  Immunizations Administered:  Pneumonia Vaccine:    Vaccine Type: Pneumovax    Site: left deltoid    Mfr: Merck    Dose: 0.5 ml    Route: IM    Given by: Kern Reap CMA (AAMA)    Exp. Date: 10/07/2011    Lot #: [id number]    VIS given: 05/12/09 version given June 12, 2010.

## 2010-07-09 NOTE — Assessment & Plan Note (Signed)
Summary: emp-will fast//ccm   Vital Signs:  Patient profile:   64 year old male Height:      70.25 inches Weight:      196 pounds BMI:     28.02 O2 Sat:      98 % on Room air Temp:     98.7 degrees F oral Pulse rate:   64 / minute BP sitting:   118 / 76  (right arm)  Vitals Entered By: Lucious Groves (June 10, 2009 8:49 AM)  O2 Flow:  Room air CC: ESt pt---CPX/refill meds--Denies any symptoms./kb Is Patient Diabetic? No Pain Assessment Patient in pain? no        Primary Care Provider:  Kelle Darting MD  CC:  ESt pt---CPX/refill meds--Denies any symptoms./kb.  History of Present Illness: Kurt Parsons is a 64 year old, married male, nonsmoker, who comes in today for physical migration with underlying problems.  He has a long-standing history of mild depression.  Is on Celexa 20 mg nightly and seemingly doing well.  He takes Zocor 20 mg nightly for hyperlipidemia.  Will check lipid panel.  For chronic reflux.  He takes proton next 40 mg prior to his evening meal.  Two chronic pain.  He is on gabapentin 800 mg q.i.d.  He's had a neurosurgical procedure to his neck and low back subsequently was referred by his neurosurgeon, Dr. Jeral Fruit to Dr. Vear Clock at the pain clinic.  He denies any major problem however, his wife says he is weak.  He falls a lot he stumbles a lot, and she is very concerned and would like further evaluation done.  He also takes him in 10 mg nightly p.r.n. for sleep dysfunction.  He takes lisinopril 40 mg daily, along with HCTZ, 25 mg daily for hypertension, BP normal on medication.  He gets routine eye care doctor.  Colonoscopy done, and GI tetanus boosted 2007 seasonal flu shot 2010  Current Medications (verified): 1)  Zocor 20 Mg  Tabs (Simvastatin) .... Take 1 Tablet By Mouth Once A Day 2)  Celexa 20 Mg  Tabs (Citalopram Hydrobromide) .... Take 1 Tablet By Mouth Once A Day 3)  Lotensin Hct 20-12.5 Mg  Tabs (Benazepril-Hydrochlorothiazide) .... Take 1 Tablet  By Mouth Every Morning 4)  Gabapentin 800 Mg  Tabs (Gabapentin) .... Take 1 Tablet By Mouth Four Times A Day 5)  Protonix 40 Mg  Tbec (Pantoprazole Sodium) .Marland Kitchen.. 1 Each Day 30 Minutes Before Meal 6)  Cialis 20 Mg Tabs (Tadalafil) .... Uad 7)  Ambien 10 Mg Tabs (Zolpidem Tartrate) .Marland Kitchen.. 1 Tab @ Bedtime 8)  Lisinopril 40 Mg Tabs (Lisinopril) .... Take One Tab Once Daily 9)  Hydrochlorothiazide 25 Mg Tabs (Hydrochlorothiazide) .... Take One Tablet Daily 10)  Biaxin 500 Mg Tabs (Clarithromycin) .... Take 1 Tablet By Mouth Two Times A Day 11)  Prednisone 20 Mg Tabs (Prednisone) .... Uad 12)  Hydromet 5-1.5 Mg/72ml Syrp (Hydrocodone-Homatropine) .Marland Kitchen.. 1 or 2 Tsps Three Times A Day As Needed 13)  Omeprazole 40 Mg Cpdr (Omeprazole) .... Take One Tab 30 Minutes Before A Meal  Allergies (verified): No Known Drug Allergies  Past History:  Past medical, surgical, family and social histories (including risk factors) reviewed, and no changes noted (except as noted below).  Past Medical History: Reviewed history from 10/26/2007 and no changes required. Lazy Eye - R High Cholesterol ED Sleep Disturbance Hypertension Hemorrhoids cervical disk surgery x 2 lumbar disk surgery x 2 chronic pain syndrome Diverticulosis  Past Surgical History: Reviewed history from  10/26/2007 and no changes required. Colonoscopy Neck Surgery Back Surgery Hemorrhoid Surrgery 1980  Family History: Reviewed history from 03/07/2007 and no changes required. Family History Diabetes 1st degree relative Family History Hypertension Family History of Stroke M 1st degree relative <50 Family History of Cardiovascular disorder Family History of Respiratory disease  Social History: Reviewed history from 10/26/2007 and no changes required. Former Smoker:  Quit 1976 Drug use-no Married Regular exercise-no Occupation: Retired Alcohol Use - no Daily Caffeine Use:  1 cup tea Illicit Drug Use - no  Review of Systems       See HPI  Physical Exam  General:  Well-developed,well-nourished,in no acute distress; alert,appropriate and cooperative throughout examination Head:  Normocephalic and atraumatic without obvious abnormalities. No apparent alopecia or balding. Eyes:  No corneal or conjunctival inflammation noted. EOMI. Perrla. Funduscopic exam benign, without hemorrhages, exudates or papilledema. Vision grossly normal. Ears:  External ear exam shows no significant lesions or deformities.  Otoscopic examination reveals clear canals, tympanic membranes are intact bilaterally without bulging, retraction, inflammation or discharge. Hearing is grossly normal bilaterally. Nose:  External nasal examination shows no deformity or inflammation. Nasal mucosa are pink and moist without lesions or exudates. Mouth:  Oral mucosa and oropharynx without lesions or exudates.  Teeth in good repair. Neck:  No deformities, masses, or tenderness noted. Chest Wall:  No deformities, masses, tenderness or gynecomastia noted. Breasts:  No masses or gynecomastia noted Lungs:  Normal respiratory effort, chest expands symmetrically. Lungs are clear to auscultation, no crackles or wheezes. Heart:  Normal rate and regular rhythm. S1 and S2 normal without gallop, murmur, click, rub or other extra sounds. Abdomen:  Bowel sounds positive,abdomen soft and non-tender without masses, organomegaly or hernias noted. Rectal:  No external abnormalities noted. Normal sphincter tone. No rectal masses or tenderness. Genitalia:  Testes bilaterally descended without nodularity, tenderness or masses. No scrotal masses or lesions. No penis lesions or urethral discharge. Prostate:  Prostate gland firm and smooth, no enlargement, nodularity, tenderness, mass, asymmetry or induration. Msk:  No deformity or scoliosis noted of thoracic or lumbar spine.   Pulses:  R and L carotid,radial,femoral,dorsalis pedis and posterior tibial pulses are full and equal  bilaterally Extremities:  No clubbing, cyanosis, edema, or deformity noted with normal full range of motion of all joints.   Neurologic:  No cranial nerve deficits noted. Station and gait are normal. Plantar reflexes are down-going bilaterally. DTRs are symmetrical throughout. Sensory, motor and coordinative functions appear intact. Skin:  Intact without suspicious lesions or rashes Cervical Nodes:  No lymphadenopathy noted Axillary Nodes:  No palpable lymphadenopathy Inguinal Nodes:  No significant adenopathy Psych:  Cognition and judgment appear intact. Alert and cooperative with normal attention span and concentration. 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 Uad His updated medication list for this problem includes:    Levitra 20 Mg Tabs (Vardenafil hcl) ..... Uad  Orders: Venipuncture (09811) TLB-Lipid Panel (80061-LIPID) TLB-BMP (Basic Metabolic Panel-BMET) (80048-METABOL) TLB-CBC Platelet - w/Differential (85025-CBCD) TLB-Hepatic/Liver Function Pnl (  80076-HEPATIC) TLB-TSH (Thyroid Stimulating Hormone) (84443-TSH) TLB-PSA (Prostate Specific Antigen) (84153-PSA) Prescription Created Electronically [phone]) UA Dipstick w/o Micro (automated)  (81003)  Problem # 3:  HYPERTENSION  (ICD-401.9) Assessment: Improved  The following medications were removed from the medication list:    Lotensin Hct 20-12.5 Mg Tabs (Benazepril-hydrochlorothiazide) .Marland Kitchen... Take 1 tablet by mouth every morning His updated medication list for this problem includes:    Lisinopril 40 Mg Tabs (Lisinopril) .Marland Kitchen... Take one tab once daily    Hydrochlorothiazide 25 Mg Tabs (Hydrochlorothiazide) .Marland Kitchen... 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 Take 1 tablet by mouth once a day 2)  Celexa 20 Mg Tabs (Citalopram hydrobromide) .... Take 1 tablet by mouth once a day 3)  Gabapentin 800 Mg Tabs (Gabapentin) .... Take 1 tablet by mouth four times a day 4)  Protonix 40 Mg Tbec (Pantoprazole sodium) .Marland Kitchen.. 1 each day 30 minutes before meal 5)  Ambien 10 Mg Tabs (Zolpidem tartrate) .Marland Kitchen.. 1 tab @ bedtime 6)  Lisinopril 40 Mg Tabs (Lisinopril) .... Take one tab once daily 7)  Hydrochlorothiazide 25 Mg Tabs (Hydrochlorothiazide) .... Take one tablet daily 8)  Levitra 20 Mg Tabs (Vardenafil hcl) .... Uad  Other Orders: EKG w/ Interpretation (93000)  Patient Instructions: 1)  Please schedule a follow-up appointment in 1 year. 2)  I will call you and I did show labwork back to discuss further evaluation of the neuropathy Prescriptions: AMBIEN 10 MG TABS (ZOLPIDEM TARTRATE) 1 tab @ bedtime  #100 x 3   Entered and Authorized by:   Roderick Pee MD   Signed by:   Roderick Pee MD on 06/10/2009   Method used:   Print then Give to Patient   RxID:   [id number] HYDROCHLOROTHIAZIDE 25 MG TABS (HYDROCHLOROTHIAZIDE) take one tablet daily  #90 x 4   Entered and Authorized by:   Roderick Pee MD   Signed by:   Roderick Pee MD on 06/10/2009   Method used:   Electronically to        [facility]. [phone]* (retail)       [address]        [place], [place]  [address]       Ph: [phone]       Fax: [phone]   RxID:   [id number] LISINOPRIL 40 MG TABS (LISINOPRIL) take one tab once daily  #90 x 4   Entered and Authorized by:   Roderick Pee MD   Signed by:   Roderick Pee MD on 06/10/2009   Method used:   Electronically to        [facility]. [phone]* (retail)       [address]       [place], [place]  [address]       Ph: [phone]       Fax: [phone]   RxID:   [id number] PROTONIX 40 MG  TBEC (PANTOPRAZOLE SODIUM) 1 each day 30 minutes before meal  #100 x 3   Entered and Authorized by:   Roderick Pee MD   Signed by:   Roderick Pee MD on 06/10/2009   Method used:   Electronically to        [facility]. #  [phone]* (retail)       [address]       [place], [place]  [address]       Ph: [phone]       Fax: [phone]   RxID:   [id number] GABAPENTIN 800 MG  TABS (GABAPENTIN) Take 1 tablet by mouth four times a day  #400 x 3   Entered and Authorized by:   Roderick Pee MD   Signed by:   Roderick Pee MD on 06/10/2009   Method used:   Electronically to        [facility]. [phone]* (retail)       [address]       [place], [place]  [address]       Ph: [phone]       Fax: [phone]   RxID:   [id number] CELEXA 20 MG  TABS (CITALOPRAM HYDROBROMIDE) Take 1 tablet by mouth once a day  #100 x 3   Entered and Authorized by:   Roderick Pee MD   Signed by:   Roderick Pee MD on 06/10/2009   Method used:   Electronically to        [facility]. [phone]* (retail)       [address]       [place], [place]  [address]       Ph: [phone]       Fax: [phone]   RxID:   [id number] ZOCOR 20 MG  TABS (SIMVASTATIN) Take 1 tablet by mouth once a day  #100 x 3   Entered and Authorized by:   Roderick Pee MD    Signed by:   Roderick Pee MD on 06/10/2009   Method used:   Electronically to        [facility]. [phone]* (retail)       [address]       [place], [place]  [address]       Ph: [phone]       Fax: [phone]   RxID:   [id number] LEVITRA 20 MG TABS (VARDENAFIL HCL) UAD  #6 x 11   Entered and Authorized by:   Roderick Pee MD   Signed by:   Roderick Pee MD on 06/10/2009   Method used:   Electronically to        [facility]. [phone]* (retail)       [address]       [place], [place]  [address]       Ph: [phone]       Fax: [phone]   RxID:   [id number]   Preventive Care Screening  Last Flu Shot:    Date:  04/07/2009    Results:  Historical   Colonoscopy:    Date:  06/08/2007    Results:  normal    Laboratory Results   Urine Tests    Routine Urinalysis   Color: yellow Appearance: Clear Glucose: negative   (Normal Range: Negative) Bilirubin: negative   (Normal Range: Negative) Ketone: negative   (Normal Range: Negative) Spec. Gravity: 1.020   (Normal Range: 1.003-1.035) Blood: trace-intact   (Normal Range: Negative) pH: 6.0   (Normal Range: 5.0-8.0) Protein: trace   (  Normal Range: Negative) Urobilinogen: 1.0   (Normal Range: 0-1) Nitrite: negative   (Normal Range: Negative) Leukocyte Esterace: negative   (Normal Range: Negative)    Comments: Joanne Chars CMA  June 10, 2009 12:38 PM

## 2010-08-12 ENCOUNTER — Other Ambulatory Visit: Payer: Self-pay | Admitting: Family Medicine
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
## 2010-11-03 ENCOUNTER — Other Ambulatory Visit: Payer: Self-pay | Admitting: Family Medicine

## 2010-12-21 ENCOUNTER — Emergency Department (HOSPITAL_COMMUNITY): Payer: Medicare Other

## 2010-12-21 ENCOUNTER — Inpatient Hospital Stay (HOSPITAL_COMMUNITY)
Admission: EM | Admit: 2010-12-21 | Discharge: 2010-12-22 | DRG: 312 | Disposition: A | Discharge status: Home / Self Care | Payer: Medicare Other | Attending: Internal Medicine | Admitting: Internal Medicine

## 2010-12-21 DIAGNOSIS — M549 Dorsalgia, unspecified: Secondary | ICD-10-CM | POA: Diagnosis present

## 2010-12-21 DIAGNOSIS — I959 Hypotension, unspecified: Secondary | ICD-10-CM | POA: Diagnosis present

## 2010-12-21 DIAGNOSIS — R55 Syncope and collapse: Principal | ICD-10-CM | POA: Diagnosis present

## 2010-12-21 DIAGNOSIS — T502X5A Adverse effect of carbonic-anhydrase inhibitors, benzothiadiazides and other diuretics, initial encounter: Secondary | ICD-10-CM | POA: Diagnosis present

## 2010-12-21 DIAGNOSIS — E785 Hyperlipidemia, unspecified: Secondary | ICD-10-CM | POA: Diagnosis present

## 2010-12-21 DIAGNOSIS — F3289 Other specified depressive episodes: Secondary | ICD-10-CM | POA: Diagnosis present

## 2010-12-21 DIAGNOSIS — E876 Hypokalemia: Secondary | ICD-10-CM | POA: Diagnosis present

## 2010-12-21 DIAGNOSIS — K219 Gastro-esophageal reflux disease without esophagitis: Secondary | ICD-10-CM | POA: Diagnosis present

## 2010-12-21 DIAGNOSIS — D696 Thrombocytopenia, unspecified: Secondary | ICD-10-CM | POA: Diagnosis present

## 2010-12-21 DIAGNOSIS — F329 Major depressive disorder, single episode, unspecified: Secondary | ICD-10-CM | POA: Diagnosis present

## 2010-12-21 DIAGNOSIS — I4510 Unspecified right bundle-branch block: Secondary | ICD-10-CM | POA: Diagnosis present

## 2010-12-21 DIAGNOSIS — G8929 Other chronic pain: Secondary | ICD-10-CM | POA: Diagnosis present

## 2010-12-21 DIAGNOSIS — I498 Other specified cardiac arrhythmias: Secondary | ICD-10-CM | POA: Diagnosis present

## 2010-12-21 DIAGNOSIS — I10 Essential (primary) hypertension: Secondary | ICD-10-CM | POA: Diagnosis present

## 2010-12-21 LAB — URINALYSIS, ROUTINE W REFLEX MICROSCOPIC
Hgb urine dipstick: NEGATIVE
Nitrite: NEGATIVE
Specific Gravity, Urine: 1.012 (ref 1.005–1.030)
Urobilinogen, UA: 1 mg/dL (ref 0.0–1.0)
pH: 7 (ref 5.0–8.0)

## 2010-12-21 LAB — COMPREHENSIVE METABOLIC PANEL
AST: 19 U/L (ref 0–37)
Albumin: 3.4 g/dL — ABNORMAL LOW (ref 3.5–5.2)
Alkaline Phosphatase: 58 U/L (ref 39–117)
BUN: 17 mg/dL (ref 6–23)
Potassium: 3.4 mEq/L — ABNORMAL LOW (ref 3.5–5.1)
Total Protein: 6.7 g/dL (ref 6.0–8.3)

## 2010-12-21 LAB — CK TOTAL AND CKMB (NOT AT ARMC)
CK, MB: 2.1 ng/mL (ref 0.3–4.0)
Relative Index: 1.6 (ref 0.0–2.5)

## 2010-12-21 LAB — DIFFERENTIAL
Basophils Absolute: 0 10*3/uL (ref 0.0–0.1)
Eosinophils Relative: 2 % (ref 0–5)
Lymphocytes Relative: 28 % (ref 12–46)
Lymphs Abs: 1.9 10*3/uL (ref 0.7–4.0)
Monocytes Absolute: 0.5 10*3/uL (ref 0.1–1.0)
Monocytes Relative: 7 % (ref 3–12)
Neutro Abs: 4.2 10*3/uL (ref 1.7–7.7)

## 2010-12-21 LAB — CBC
HCT: 38.2 % — ABNORMAL LOW (ref 39.0–52.0)
Hemoglobin: 13.5 g/dL (ref 13.0–17.0)
MCH: 31.2 pg (ref 26.0–34.0)
MCHC: 35.3 g/dL (ref 30.0–36.0)
MCV: 88.2 fL (ref 78.0–100.0)
RDW: 12.7 % (ref 11.5–15.5)

## 2010-12-21 LAB — TROPONIN I: Troponin I: <0.3 ng/mL (ref <0.30)

## 2010-12-21 LAB — GLUCOSE, CAPILLARY: Glucose-Capillary: 89 mg/dL (ref 70–99)

## 2010-12-21 LAB — ETHANOL: Alcohol, Ethyl (B): <11 mg/dL (ref 0–11)

## 2010-12-21 LAB — PROTIME-INR: INR: 0.95 (ref 0.00–1.49)

## 2010-12-22 DIAGNOSIS — I517 Cardiomegaly: Secondary | ICD-10-CM

## 2010-12-22 LAB — GLUCOSE, CAPILLARY: Glucose-Capillary: 74 mg/dL (ref 70–99)

## 2010-12-22 LAB — COMPREHENSIVE METABOLIC PANEL
ALT: 20 U/L (ref 0–53)
AST: 17 U/L (ref 0–37)
Albumin: 3 g/dL — ABNORMAL LOW (ref 3.5–5.2)
Alkaline Phosphatase: 52 U/L (ref 39–117)
Calcium: 8.5 mg/dL (ref 8.4–10.5)
GFR calc Af Amer: >60 mL/min (ref >60)
Potassium: 3.4 mEq/L — ABNORMAL LOW (ref 3.5–5.1)
Sodium: 138 mEq/L (ref 135–145)
Total Protein: 6 g/dL (ref 6.0–8.3)

## 2010-12-22 LAB — CBC
Hemoglobin: 12.6 g/dL — ABNORMAL LOW (ref 13.0–17.0)
Platelets: 155 10*3/uL (ref 150–400)
RBC: 4.02 MIL/uL — ABNORMAL LOW (ref 4.22–5.81)
WBC: 6.4 10*3/uL (ref 4.0–10.5)

## 2010-12-22 LAB — LIPID PANEL
HDL: 47 mg/dL (ref >39)
LDL Cholesterol: 87 mg/dL (ref 0–99)
Total CHOL/HDL Ratio: 3.1 RATIO
VLDL: 11 mg/dL (ref 0–40)

## 2010-12-22 LAB — PRO B NATRIURETIC PEPTIDE: Pro B Natriuretic peptide (BNP): 66.7 pg/mL (ref 0–125)

## 2010-12-22 LAB — CARDIAC PANEL(CRET KIN+CKTOT+MB+TROPI)
CK, MB: 1.9 ng/mL (ref 0.3–4.0)
Relative Index: 1.7 (ref 0.0–2.5)
Relative Index: 1.7 (ref 0.0–2.5)
Troponin I: <0.3 ng/mL (ref <0.30)
Troponin I: <0.3 ng/mL (ref <0.30)

## 2010-12-22 LAB — TSH: TSH: 3.432 u[IU]/mL (ref 0.350–4.500)

## 2010-12-22 LAB — PHOSPHORUS: Phosphorus: 2.9 mg/dL (ref 2.3–4.6)

## 2010-12-22 LAB — MAGNESIUM: Magnesium: 2 mg/dL (ref 1.5–2.5)

## 2010-12-24 ENCOUNTER — Ambulatory Visit (INDEPENDENT_AMBULATORY_CARE_PROVIDER_SITE_OTHER): Payer: Medicare Other | Admitting: Family Medicine

## 2010-12-25 ENCOUNTER — Encounter: Payer: Self-pay | Admitting: Family Medicine

## 2010-12-28 ENCOUNTER — Ambulatory Visit (INDEPENDENT_AMBULATORY_CARE_PROVIDER_SITE_OTHER): Payer: Medicare Other | Admitting: Family Medicine

## 2010-12-28 ENCOUNTER — Encounter: Payer: Self-pay | Admitting: Family Medicine

## 2010-12-28 DIAGNOSIS — N529 Male erectile dysfunction, unspecified: Secondary | ICD-10-CM

## 2010-12-28 DIAGNOSIS — R55 Syncope and collapse: Secondary | ICD-10-CM

## 2010-12-28 DIAGNOSIS — I10 Essential (primary) hypertension: Secondary | ICD-10-CM

## 2010-12-28 MED ORDER — VARDENAFIL HCL 10 MG PO TABS: 10.0000 mg | ORAL_TABLET | ORAL | Status: DC | PRN

## 2010-12-28 NOTE — Patient Instructions (Signed)
Do not take the lisinopril tomorrow restarted on Wednesday, but only take 20 mg daily.  Check your blood pressure daily in the morning.  Return in two weeks for follow-up.  Also, a normal range a cardiac consult with Dr. Alinda Deem

## 2010-12-28 NOTE — Progress Notes (Signed)
  Subjective:    Patient ID: Kurt Parsons, male    DOB: Sep 22, 1945, 65 y.o.   MRN: [id number]  HPI Kurt Parsons Is a 65 year old, married male, nonsmoker, who comes in today following her hospitalization on July the 16th to July, the 17th for observation after a syncopal episode.  He states on July the 16th he was painting a door.  It was not all that hot.  It was in the evening, around 6 p.m., and it without warning.  He passed out.  He fell on his face.  He will come face down got up and went in and told his wife.  He didn't feel well.  He went to bed and then the next thing he remembers around 11 p.m. He woke up in the hospital.  He does not recall going to the hospital via EMS.  He was admitted and observed and discharged the following day with a discharge diagnosis of vasovagal syncope.  However, on concerned about them under cardiac line cardiac problem.  His blood pressure today was 110/70.  They advised and the cut is lisinopril 40 mg daily, which he has not done yet.   Review of Systems    General neurologic and cardiovascular review of systems otherwise negative Objective:   Physical Exam Well-developed and nourished man in no acute distress.  Cardiac exam shows the pulse to be 60 and regular.  BP 110/70, and when he stands up suddenly.  He does get lightheaded.       Assessment & Plan:  Syncope question secondary to hypotension versus cardiac abnormality.  Plan decrease the lisinopril from 40 mg a day to 20.  BP check.  Daily follow-up in 3 weeks also, cardiac eval

## 2010-12-30 NOTE — Discharge Summary (Signed)
  NAMENDREW, CREASON NO.:  [id number]  MEDICAL RECORD NO.:  [id number]  LOCATION:  2038                         FACILITY:  [facility]  PHYSICIAN:  Lonia Blood, M.D.       DATE OF BIRTH:  12/02/1945  DATE OF ADMISSION:  12/21/2010 DATE OF DISCHARGE:  12/22/2010                              DISCHARGE SUMMARY   PRIMARY CARE PHYSICIAN:  Tinnie Gens A. Tawanna Cooler, MD  DISCHARGE DIAGNOSES: 1. Syncope most likely vasovagal in the context of heat exhaustion and     hypotension. 2. Sinus bradycardia with right bundle-branch block, asymptomatic. 3. Hyperlipidemia. 4. Hypertension with left ventricular hypertrophy. 5. Depression. 6. Gastroesophageal reflux disease. 7. Mild hypokalemia on admission most likely due to     hydrochlorothiazide usage.  DISCHARGE MEDICATIONS: 1. Tylenol 650 mg by mouth every 4 hours as needed for pain. 2. Lisinopril 20 mg daily. 3. Gabapentin 800 mg four times a day. 4. Celexa 20 mg daily. 5. Simvastatin 20 mg daily. 6. Omeprazole 40 mg daily. 7. Hydrochlorothiazide 25 mg daily.  CONDITION ON DISCHARGE:  Kurt Parsons was discharged in good condition, alert and oriented, no acute distress.  Temperature 98.0, heart rate 65, respiration 18, blood pressure 106/59, saturation 98% on room air.  The patient will follow up with his primary care physician, Dr. Kelle Darting.  PROCEDURES THIS ADMISSION: 1. The patient underwent a head CT without contrast on December 21, 2010,     showing no acute intracranial abnormalities. 2. Transthoracic echocardiogram on December 22, 2010, findings of left     ventricular hypertrophy, ejection fraction 50-55%, grade II     diastolic dysfunction.  HISTORY AND PHYSICAL:  Refer to the dictation done by Dr. Mikeal Hawthorne.  HOSPITAL COURSE:  [name] is a 65 year old gentleman with hypertension and hyperlipidemia, presented to the emergency room after episode of syncope while seem to be causing extreme heat and humidity. Initially  in the emergency room, the patient was having sinus bradycardia into the 50s.  Due to the concern for possible cardiac arrhythmias, sick sinus syndrome, however, an acute coronary syndrome, Mr. Roepke was admitted to hospital for telemetry monitoring and further observation.  During the period of observation, the patient remained without any arrhythmia.  His heart rates were into the 60s.  He remained in sinus bradycardia with right bundle-branch block.  He had three sets of cardiac enzymes, all are within normal limits.  His transthoracic echocardiogram revealed preserved ejection fraction and left ventricular hypertrophy.  They noticed that the patient was having systolic blood pressure into the 100s.  With this reason, we have decreased his dose of lisinopril to 10 mg daily.  Otherwise, all his medications have continued unchanged.  The patient was instructed not to work outside in the heat and humidity and if he has further syncopal episodes, report to his primary care physician.     Lonia Blood, M.D.     SL/MEDQ  D:  12/23/2010  T:  12/24/2010  Job:  [id number]  cc:   Tinnie Gens A. Tawanna Cooler, MD  Electronically Signed by Lonia Blood M.D. on 12/30/2010 01:07:54 PM

## 2011-01-01 ENCOUNTER — Encounter: Payer: Self-pay | Admitting: Cardiology

## 2011-01-06 ENCOUNTER — Ambulatory Visit (INDEPENDENT_AMBULATORY_CARE_PROVIDER_SITE_OTHER): Payer: Medicare Other | Admitting: Cardiology

## 2011-01-06 ENCOUNTER — Encounter: Payer: Self-pay | Admitting: Cardiology

## 2011-01-06 DIAGNOSIS — R079 Chest pain, unspecified: Secondary | ICD-10-CM

## 2011-01-06 DIAGNOSIS — R55 Syncope and collapse: Secondary | ICD-10-CM

## 2011-01-06 DIAGNOSIS — I10 Essential (primary) hypertension: Secondary | ICD-10-CM

## 2011-01-06 DIAGNOSIS — R0602 Shortness of breath: Secondary | ICD-10-CM

## 2011-01-06 NOTE — Progress Notes (Signed)
PCP: Dr. Tawanna Cooler  65 yo with history of HTN presents for evaluation of syncope.  Patient was outside painting a door on 12/21/10.  While doing this, he passed out and fell flat on his face.  He does not remember any prodrome.  He woke up on the ground and went indoors.  His wife found him confused and called EMS.  He was admitted to the hospital overnight.  CT head was negative.  He was thought to be dehydrated and given IV fluid.  Echo showed normal EF and normal valves.  He was discharged home the next day.  His lisinopril was cut back from 40 mg daily to 20 mg daily.  For at least a year, he has noted lightheadedness when he bends over or squats down and then stands up.  He compensates by standing slowly.  The episode on 7/16 is the only syncopal event he has ever had.  He keeps track of his blood pressures, and BP tends to run low in the afternoon (systolic 90s -100s). Today in the office, he was borderline orthostatic with BP 112/75 lying and 97/71 standing.    Patient also reports occasional tight chest pain.  This is not related to exertion.  It is often brought on by emotional stress.  He does get short of breath after walking about 200-300 feet on flat ground or climbing a flight of steps.  This has been chronic.  He denies weight gain.    ECG: NSR, 1st degree AV block, RBBB, LPFB, rate 49  PMH: 1. HTN 2. GERD 3. Depression 4. Syncope: Episode in 7/12 with no prodrome.  Echo (7/12) with mild LVH, EF 60-65%, grade II diastolic dysfunction, normal valves.  5. Conduction systems disease: ECG with RBBB/LPFB  SH: Married, daughter is Therapist, sports at [employer], former Production designer, theatre/television/film of [employer], originally from [place].  FH: Brother, father, and mother with CVAs  ROS: All systems reviewed and negative except as per HPI.   Current Outpatient Prescriptions  Medication Sig Dispense Refill  . citalopram (CELEXA) 20 MG tablet Take 20 mg by mouth daily.        Marland Kitchen gabapentin (NEURONTIN) 800 MG  tablet Take 800 mg by mouth 2 (two) times daily.        Marland Kitchen lisinopril (PRINIVIL,ZESTRIL) 40 MG tablet Take 0.5 tablets (20 mg total) by mouth daily.      Marland Kitchen omeprazole (PRILOSEC) 40 MG capsule TAKE 1 CAPSULE BY MOUTH 30 MINUTES BEFORE A MEAL.  90 capsule  2  . simvastatin (ZOCOR) 20 MG tablet Take 20 mg by mouth at bedtime.        Marland Kitchen zolpidem (AMBIEN) 10 MG tablet Take 10 mg by mouth at bedtime as needed.        . vardenafil (LEVITRA) 10 MG tablet Take 1 tablet (10 mg total) by mouth as needed for erectile dysfunction.  6 tablet  11    BP 102/72  Pulse 64 General: NAD Neck: No JVD, no thyromegaly or thyroid nodule.  Lungs: Clear to auscultation bilaterally with normal respiratory effort. CV: Nondisplaced PMI.  Heart regular S1/S2, no S3/S4, no murmur.  No peripheral edema.  No carotid bruit.  Normal pedal pulses.  Abdomen: Soft, nontender, no hepatosplenomegaly, no distention.  Skin: Intact without lesions or rashes.  Neurologic: Alert and oriented x 3.  Psych: Normal affect. Extremities: No clubbing or cyanosis.  HEENT: Normal.

## 2011-01-06 NOTE — Assessment & Plan Note (Signed)
Patient had a syncopal event without prodrome in 7/12.  He was outside working on a hot day.  He has a long history of orthostatic-type symptoms, and he was orthostatic today in the office even after cutting back on his lisinopril.  It is certainly possible that the syncope could have been due to dehydration/orthostasis in the setting of ongoing use of antihypertensive meds.  However, he has evidence for significant conduction system disease on his ECG (1st degree AV block, LPFB, and RBBB).  He is certainly at risk for high grade heart block.   - I will have him wear an event monitor for 3 wks.  - I am going to have him stop HCTZ to try to limit orthostatic symptoms.  Continue lisinopril 20 mg daily for now.

## 2011-01-06 NOTE — Patient Instructions (Signed)
Your physician recommends that you schedule a follow-up appointment in: 4 weeks with Dr Shirlee Latch  Your physician has recommended that you wear an event monitor. Event monitors are medical devices that record the heart's electrical activity. Doctors most often [place] these monitors to diagnose arrhythmias. Arrhythmias are problems with the speed or rhythm of the heartbeat. The monitor is a small, portable device. You can wear one while you do your normal daily activities. This is usually used to diagnose what is causing palpitations/syncope (passing out).  Your physician has requested that you have en exercise stress myoview. For further information please visit [email or web address] Please follow instruction sheet, as given.  Your physician has recommended you make the following change in your medication: Stop HCTZ  Dr Alford Highland nurse will call you in 2 weeks's to check on your blood pressure

## 2011-01-06 NOTE — Assessment & Plan Note (Signed)
Patient has atypical chest pain as well exertional dyspnea.  Given his syncopal event, I would like to rule out ischemia.  I will arrange an ETT-myoview.   Followup in 3-4 weeks.

## 2011-01-06 NOTE — Assessment & Plan Note (Signed)
He will record blood pressure and we will call him in 2 weeks to make sure blood pressure is not remaining low or getting too high.

## 2011-01-11 ENCOUNTER — Ambulatory Visit (INDEPENDENT_AMBULATORY_CARE_PROVIDER_SITE_OTHER): Payer: Medicare Other | Admitting: Family Medicine

## 2011-01-11 ENCOUNTER — Encounter: Payer: Self-pay | Admitting: Family Medicine

## 2011-01-11 DIAGNOSIS — R55 Syncope and collapse: Secondary | ICD-10-CM

## 2011-01-11 DIAGNOSIS — I10 Essential (primary) hypertension: Secondary | ICD-10-CM

## 2011-01-11 NOTE — Progress Notes (Signed)
  Subjective:    Patient ID: Kurt Parsons, male    DOB: 1946-01-04, 65 y.o.   MRN: [id number]  HPI Kurt Parsons Is a 65 year old,  married male, nonsmoker, who comes in today for reevaluation of hypertension.  We have decreased his lisinopril to 20 mg a day, and the cardiologist stopped the diuretic because his blood pressure was still low.  BP today right arm sitting position 108/70.  Pulse 58  and regular.  No more syncopal episodes.   Review of Systems    General and cardiovascular views systems otherwise negative.  He set up for a cardiac stress test and a Holter monitor Objective:   Physical Exam  Well-developed well-nourished man in no acute distress.  BP 108/70, pulse 58 and regular      Assessment & Plan:  Hypotension plan decrease lisinopril to 10 mg daily.  Monitor blood pressure is in two weeks.  His pressure still remains low, stop the lisinopril, completely

## 2011-01-11 NOTE — Patient Instructions (Addendum)
Decrease the lisinopril to 10 mg daily.  Check your blood pressure daily in the morning.  If after two weeks. y  blood pressure has not come up to 135/80, then stop the lisinopril, completely

## 2011-01-11 NOTE — Progress Notes (Signed)
Patient's ECG shows RBBB, LPFB, and 1st degree AV block.  The RBBB is complex with 1-1.5 mm ST elevation in inferior leads and leads V2-V6 and 1-1.5 mm ST depression in lead V1.  I don't think that doing an ETT alone would be helpful here as it will almost certainly be nondiagnostic.

## 2011-01-13 ENCOUNTER — Encounter: Payer: Self-pay | Admitting: *Deleted

## 2011-01-14 ENCOUNTER — Ambulatory Visit (HOSPITAL_COMMUNITY): Payer: Medicare Other | Attending: Cardiology | Admitting: Radiology

## 2011-01-14 ENCOUNTER — Encounter (INDEPENDENT_AMBULATORY_CARE_PROVIDER_SITE_OTHER): Payer: Medicare Other

## 2011-01-14 DIAGNOSIS — R0789 Other chest pain: Secondary | ICD-10-CM

## 2011-01-14 DIAGNOSIS — R079 Chest pain, unspecified: Secondary | ICD-10-CM | POA: Insufficient documentation

## 2011-01-14 DIAGNOSIS — R55 Syncope and collapse: Secondary | ICD-10-CM

## 2011-01-14 DIAGNOSIS — R0602 Shortness of breath: Secondary | ICD-10-CM | POA: Insufficient documentation

## 2011-01-14 MED ORDER — TECHNETIUM TC 99M TETROFOSMIN IV KIT
10.8000 | PACK | Freq: Once | INTRAVENOUS | Status: AC | PRN
  Administered 2011-01-14: 11 via INTRAVENOUS

## 2011-01-14 MED ORDER — TECHNETIUM TC 99M TETROFOSMIN IV KIT
33.0000 | PACK | Freq: Once | INTRAVENOUS | Status: AC | PRN
  Administered 2011-01-14: 33 via INTRAVENOUS

## 2011-01-14 NOTE — Progress Notes (Signed)
[facility] SITE 3 NUCLEAR MED [address] [place] [address] [phone]  Cardiology Nuclear Med Study  Kurt Parsons is a 65 y.o. male [id number] [birth year]   Nuclear Med Background Indication for Stress Test:  Evaluation for Ischemia and 12/21/10 Post Hospital: syncope, (-) enzymes History: 7/12 Echo-EF- 60-65% Cardiac Risk Factors: Hypertension, Lipids and RBBB  Symptoms:  Chest Tightness (last date of chest discomfort 1 month ago), DOE, Fatigue, Light-Headedness, Rapid HR, SOB and Syncope   Nuclear Pre-Procedure Caffeine/Decaff Intake:  None NPO After: 9:00pm   Lungs:  Clear IV 0.9% NS with Angio Cath:  18g  IV Site: R Antecubital  IV Started by:  Stanton Kidney, EMT-P  Chest Size (in):  44 Cup Size: n/a  Height: 5\' 11"  (1.803 m)  Weight:  191 lb (86.637 kg)  BMI:  Body mass index is 26.64 kg/(m^2). Tech Comments:  NA     Nuclear Med Study 1 or 2 day study: 1 day  Stress Test Type:  Stress  Reading MD: Cassell Clement, MD  Order Authorizing Provider:  Dr. Golden Circle  Resting Radionuclide: Technetium 34m Tetrofosmin  Resting Radionuclide Dose: 10.8 mCi   Stress Radionuclide:  Technetium 40m Tetrofosmin  Stress Radionuclide Dose: 33.0 mCi           Stress Protocol Rest HR: 50 Stress HR: 142  Rest BP: 136/80 Stress BP: 194/100  Exercise Time (min): 7:35 METS: 9.5   Predicted Max HR: 153 bpm % Max HR: 92.81 bpm Rate Pressure Product: 59563   Dose of Adenosine (mg): N/A Dose of Lexiscan: n/a mg  Dose of Atropine (mg): n/a Dose of Dobutamine: n/a mcg/kg/min (at max HR)  Stress Test Technologist: Bonnita Levan, RN  Nuclear Technologist:  Domenic Polite, CNMT     Rest Procedure:  Myocardial perfusion imaging was performed at rest 45 minutes following the intravenous administration of Technetium 11m Tetrofosmin. Rest ECG: RBBB with 1st degree AVB  Stress Procedure:  The patient exercised for 7:35.  The patient stopped due to chest tightness  (5/10),dyspnea and hypertensive response. There were no significant ST-T wave changes.  Technetium 49m Tetrofosmin was injected at peak exercise and myocardial perfusion imaging was performed after a brief delay. Stress ECG: No significant change from baseline ECG  QPS Raw Data Images:  Normal; no motion artifact; normal heart/lung ratio. Stress Images:  Normal homogeneous uptake in all areas of the myocardium. Rest Images:  Normal homogeneous uptake in all areas of the myocardium. Subtraction (SDS):  No evidence of ischemia. Transient Ischemic Dilatation (Normal <1.22):  0.93 Lung/Heart Ratio (Normal <0.45):  0.28  Quantitative Gated Spect Images QGS EDV:  109 ml QGS ESV:  40 ml QGS cine images:  NL LV Function; NL Wall Motion QGS EF: 63%  Impression Exercise Capacity:  Good exercise capacity. BP Response:  Hypertensive blood pressure response. Clinical Symptoms:  Significant chest pain. ECG Impression:  No significant ST segment change suggestive of ischemia.  Persistent RBBB. Comparison with Prior Nuclear Study: No images to compare  Overall Impression:  Normal stress nuclear study.  Normal perfusion. Good LV systolic function.   Cassell Clement

## 2011-01-15 NOTE — Progress Notes (Addendum)
Nuclear report routed to Dr. Shirlee Latch. [facility], [place] [facility]  Normal study.  Please inform patient. [place] [employer]

## 2011-01-19 ENCOUNTER — Telehealth: Payer: Self-pay | Admitting: *Deleted

## 2011-01-19 NOTE — Telephone Encounter (Signed)
Called pt and LM on identified answering machine that stress test was normal--nt

## 2011-01-20 ENCOUNTER — Ambulatory Visit (INDEPENDENT_AMBULATORY_CARE_PROVIDER_SITE_OTHER): Payer: Medicare Other

## 2011-01-20 VITALS — BP 121/79 | HR 50 | Ht 71.0 in | Wt 192.0 lb

## 2011-01-20 DIAGNOSIS — I10 Essential (primary) hypertension: Secondary | ICD-10-CM

## 2011-01-20 NOTE — Progress Notes (Signed)
Patient in for B/P check, HCTZ mediation was discontinue on last office visit 01/06/11 with Dr. Shirlee Latch due to B/P being 102/72 . Today's  B/P in left arm 121/79, right arm 110/69. Patient took medications this AM Dr. Tawanna Cooler PCP decreased Lisinopril 20 mg  To 10 mg once a day on 01/11/11. Patient has  No C/O at this time. Dr. Johney Frame aware, pt to continue  medication as order.Patient verbalized understanding.

## 2011-01-28 NOTE — Progress Notes (Signed)
Message left for pt 01/19/11

## 2011-02-03 ENCOUNTER — Encounter: Payer: Self-pay | Admitting: Cardiology

## 2011-02-03 ENCOUNTER — Telehealth: Payer: Self-pay | Admitting: *Deleted

## 2011-02-03 ENCOUNTER — Ambulatory Visit (INDEPENDENT_AMBULATORY_CARE_PROVIDER_SITE_OTHER): Payer: Medicare Other | Admitting: Cardiology

## 2011-02-03 DIAGNOSIS — R079 Chest pain, unspecified: Secondary | ICD-10-CM

## 2011-02-03 DIAGNOSIS — R55 Syncope and collapse: Secondary | ICD-10-CM

## 2011-02-03 DIAGNOSIS — I10 Essential (primary) hypertension: Secondary | ICD-10-CM

## 2011-02-03 MED ORDER — LISINOPRIL 5 MG PO TABS: 5.0000 mg | ORAL_TABLET | Freq: Every day | ORAL | Status: DC

## 2011-02-03 NOTE — Patient Instructions (Addendum)
Decrease lisinopril to 5mg  daily.  Take aspirin 81mg  daily--this should be enteric coated.  Your physician wants you to follow-up in: 1 year with Dr Shirlee Latch. (August 2013). You will receive a reminder letter in the mail two months in advance. If you don't receive a letter, please call our office to schedule the follow-up appointment.

## 2011-02-03 NOTE — Telephone Encounter (Signed)
Dr Shirlee Latch reviewed monitor results 01/14/11-01/31/11 at time of office visit 02/03/11.

## 2011-02-03 NOTE — Progress Notes (Signed)
PCP: Dr. Tawanna Cooler  65 yo with history of HTN presented initially for evaluation of syncope.  Patient was outside painting a door on 12/21/10.  While doing this, he passed out and fell flat on his face.  He does not remember any prodrome.  He woke up on the ground and went indoors.  His wife found him confused and called EMS.  He was admitted to the hospital overnight.  CT head was negative.  He was thought to be dehydrated and given IV fluid.  Echo showed normal EF and normal valves.  He was discharged home the next day.  His lisinopril was cut back from 40 mg daily to 20 mg daily.  For at least a year, he has noted lightheadedness when he bends over or squats down and then stands up.  He compensates by standing slowly.  The episode on 7/16 is the only syncopal event he has ever had.  At last appointment, he was borderline orthostatic with BP 112/75 lying and 97/71 standing.  I had him stop his HCTZ and Dr. Tawanna Cooler later had him cut lisinopril back to 10 mg daily.  He also reported occasional atypical chest tightness as well as dyspnea with moderate exertion like walking up steps.  He really does not get any exercise.   I had Mr Nay do an ETT-myoview.  This showed average exercise tolerance with no evidence for ischemia or infarction.  3 week event monitor to look for an arrhythmic cause of syncope showed no significant arrhythmias.  Patient still occasionally gets lightheaded with standing.    ECG: NSR, 1st degree AV block, RBBB, LPFB, rate 49  PMH: 1. HTN 2. GERD 3. Depression 4. Syncope: Episode in 7/12 with no prodrome.  Echo (7/12) with mild LVH, EF 60-65%, grade II diastolic dysfunction, normal valves.  Event monitor (3 week) in 8/12 showed no significant arrhythmia.  Only mild bradycardia to the 50s likely while asleep.  5. Conduction systems disease: ECG with RBBB/LPFB 6. Atypical chest pain: ETT-myoview (8/12) 7'58", no ischemic changes on ECG or perfusion images.   SH: Married, daughter is  Therapist, sports at [employer], former Production designer, theatre/television/film of [employer], originally from [place].  FH: Brother, father, and mother with CVAs  ROS: All systems reviewed and negative except as per HPI.   Current Outpatient Prescriptions  Medication Sig Dispense Refill  . citalopram (CELEXA) 20 MG tablet Take 20 mg by mouth daily.        Marland Kitchen gabapentin (NEURONTIN) 800 MG tablet Take 800 mg by mouth 2 (two) times daily.        Marland Kitchen omeprazole (PRILOSEC) 40 MG capsule TAKE 1 CAPSULE BY MOUTH 30 MINUTES BEFORE A MEAL.  90 capsule  2  . simvastatin (ZOCOR) 20 MG tablet Take 20 mg by mouth at bedtime.        Marland Kitchen zolpidem (AMBIEN) 10 MG tablet Take 10 mg by mouth at bedtime as needed.        Marland Kitchen DISCONTD: lisinopril (PRINIVIL,ZESTRIL) 40 MG tablet Take 10 mg by mouth daily.        Marland Kitchen aspirin EC 81 MG tablet Take 1 tablet (81 mg total) by mouth daily.      Marland Kitchen lisinopril (PRINIVIL,ZESTRIL) 5 MG tablet Take 1 tablet (5 mg total) by mouth daily.  30 tablet  11  . DISCONTD: lisinopril (PRINIVIL,ZESTRIL) 40 MG tablet Take 0.5 tablets (20 mg total) by mouth daily.        BP 123/78  Pulse 62  Ht  5\' 11"  (1.803 m)  Wt 192 lb (87.091 kg)  BMI 26.78 kg/m2 General: NAD Neck: No JVD, no thyromegaly or thyroid nodule.  Lungs: Clear to auscultation bilaterally with normal respiratory effort. CV: Nondisplaced PMI.  Heart regular S1/S2, no S3/S4, no murmur.  No peripheral edema.  No carotid bruit.  Normal pedal pulses.  Abdomen: Soft, nontender, no hepatosplenomegaly, no distention.  Neurologic: Alert and oriented x 3.  Psych: Normal affect. Extremities: No clubbing or cyanosis.

## 2011-02-03 NOTE — Assessment & Plan Note (Signed)
I think that this is primarily due to deconditioning.  He is not very active at all.  No evidence for CHF or significant lung disease.  I have asked him to try to walk for up to 30 minutes daily on most days.

## 2011-02-03 NOTE — Assessment & Plan Note (Signed)
Patient still gets lightheaded with standing at times.  SBP runs primarily in the 110s-120s when he checks.  I will have him back off further on lisinopril to 5 mg daily.

## 2011-02-03 NOTE — Assessment & Plan Note (Signed)
Patient had a syncopal event without prodrome in 7/12.  He was outside working on a hot day.  He has a long history of orthostatic-type symptoms, and he was orthostatic at last office visit.  Despite cutting back on his BP meds, he still gets lightheaded with standing.  He does have evidence for significant conduction system disease on his ECG (1st degree AV block, LPFB, and RBBB) and is certainly at risk for high grade heart block.  I had him wear a 3-week event monitor that showed no significant arrhythmias.  Echo was unremarkable.  Based on his symptoms and the workup, I think that his symptoms were most likely orthostatic related to dehydration in the face of ongoing antihypertensive use.

## 2011-02-03 NOTE — Assessment & Plan Note (Signed)
Atypical, suspect noncardiac with non-ischemic myoview.

## 2011-02-13 ENCOUNTER — Other Ambulatory Visit: Payer: Self-pay | Admitting: Family Medicine

## 2011-02-15 ENCOUNTER — Other Ambulatory Visit: Payer: Self-pay | Admitting: Family Medicine

## 2011-02-16 ENCOUNTER — Other Ambulatory Visit: Payer: Self-pay | Admitting: Family Medicine

## 2011-03-03 LAB — COMPREHENSIVE METABOLIC PANEL
AST: 25
Albumin: 3.6
Calcium: 9.9
Chloride: 100
Creatinine, Ser: 0.99
GFR calc Af Amer: >60

## 2011-03-03 LAB — DIFFERENTIAL
Eosinophils Relative: 0
Lymphocytes Relative: 20
Lymphs Abs: 2
Monocytes Absolute: 0.7

## 2011-03-03 LAB — URINE MICROSCOPIC-ADD ON

## 2011-03-03 LAB — POCT CARDIAC MARKERS
CKMB, poc: <1 — ABNORMAL LOW
Myoglobin, poc: 73.3

## 2011-03-03 LAB — URINALYSIS, ROUTINE W REFLEX MICROSCOPIC
Bilirubin Urine: NEGATIVE
Hgb urine dipstick: NEGATIVE
Specific Gravity, Urine: 1.024
pH: 7.5

## 2011-03-03 LAB — CBC
Hemoglobin: 15.5
MCHC: 33.8
Platelets: 145 — ABNORMAL LOW
RDW: 12.5

## 2011-03-03 NOTE — Progress Notes (Signed)
Agree above.  Kurt Parsons [employer]

## 2011-03-20 NOTE — H&P (Signed)
Kurt Parsons, Kurt Parsons NO.:  [id number]  MEDICAL RECORD NO.:  [id number]  LOCATION:  [facility]                         FACILITY:  [facility]  PHYSICIAN:  Lonia Blood, M.D.      DATE OF BIRTH:  [birth year]  DATE OF ADMISSION:  12/21/2010 DATE OF DISCHARGE:                             HISTORY & PHYSICAL   PRIMARY CARE PHYSICIAN:  Tinnie Gens A. Tawanna Cooler, MD  PRESENTING COMPLAINT:  Passing out.  HISTORY OF PRESENT ILLNESS:  The patient is a 65 year old gentleman who was apparently out all day in each day.  She came into the house this evening and his wife saw him around 5:50 he was normal, but arrival at 5:45 he told her he passed out.  Since then he has been confused out of it until he arrived the ED.  The patient remained so until he was about an hour ago.  At this point, he is completely awake, alert, oriented and back to himself.  He reported spells of dizziness in the last month or 2 on and off.  Has had headache today.  Denied any chills, chest pain or shortness of breath.  Denied any fever, nausea, vomiting or diarrhea. He has history of hypertension, hyperlipidemia, GERD, depression, anxiety and multiple back surgeries.  He also has been taking his medications without missing any.  PAST MEDICAL HISTORY:  Again is hyperlipidemia, hypertension, depression, GERD and hypokalemia.  ALLERGIES:  HE HAS NO KNOWN DRUG ALLERGIES.  CURRENT MEDICATIONS:  Include 1. Neurontin 300 mg p.o. b.i.d. 2. Hydrochlorothiazide 25 mg daily. 3. Lisinopril 40 mg daily. 4. Simvastatin 40 mg at night. 5. Citalopram 20 mg daily. 6. Omeprazole 40 mg daily.  SOCIAL HISTORY:  The patient is married and lives with his wife.  He is originally from [place].  No tobacco, alcohol or IV drug use.  He has two grown-up children a boy and a daughter.  Daughter is a pediatric physician at [employer].  FAMILY HISTORY:  Mainly hypertension.  REVIEW OF SYSTEMS:  All system reviewed are negative except per  HPI.  PHYSICAL EXAMINATION:  VITAL SIGNS:  Temperature is 97.2.  Initial blood pressure 120/77, his pulse was as low as 49 and currently at 53, respiratory rate 18.  His sats 99% room air. GENERAL:  He is awake, alert, oriented, pleasant man.  He is in no acute distress. HEENT:  PERRL.  EOMI.  No pallor, no jaundice.  No rhinorrhea. NECK:  Supple.  No visible JVD, no lymphadenopathy. RESPIRATORY:  He has good air entry bilaterally.  No wheezes, no rales, no crackles. CARDIOVASCULAR SYSTEM:  He has S1 and S2.  No murmur. ABDOMEN:  Soft, full, nontender with positive bowel sound. EXTREMITIES:  Show no edema, cyanosis or clubbing. SKIN:  No rashes or ulcers.  LABORATORY DATA:  His white count is 6.7, hemoglobin 13.5 with platelet count of 146.  His PT is 12.9, INR 0.95, PTT of 33.  Initial cardiac enzymes are negative.  Alcohol level less than 11.  Sodium is 137, potassium 3.4, chloride 101, CO2 25, glucose is 69, BUN 17, creatinine 0.85, calcium 8.9, total protein 6.7.  The rest of the LFTs within normal.  His chest  x-ray showed poor inspiration with mild cardiomegaly and central pulmonary venous less prominence and mild torture aorta. There is sclerotic focus in the left lung apex which is unchanged from previous x-rays.  Head CT without contrast showed no intracranial hemorrhage or any acute findings.  His EKG shows sinus bradycardia with a rate of 49.  There is evidence of first-degree AV block, but no ectopy, also left axis deviation, otherwise mild flattening of T-waves. There is also possibility of left anterior fascicular block with right bundle branch block.  ASSESSMENT:  This is a 65 year old gentleman presenting with what appeared to be syncopal episode and here with persistent bradycardia. His sudden dizzy spells are apparently on and off what I suspect this is a combination of bradycardia and mild hypoglycemia that he had may have contributed to the syncopal  episode.  PLAN: 1. Syncope.  We will admit the patient to tele bed for observation.     Check his serial cardiac enzyme.  Check a 2-D echo.  I will check     carotid Doppler also.  I will hold of an MRI as the patient had     prior orthopedic surgery, so he probably has hardware and besides     this seems more cardiac anything.  I will repeat the EKG in the     morning and see if this symptoms persist and will get Cardiology     consult as necessary.  The patient may end up getting a pacemaker     if he continues to have especially with symptoms of dizziness and     lightheadedness.  We will check orthostatics now and in the     morning. 2. Hypokalemia.  Replete his potassium. 3. Hypertension, blood pressure seems reasonable control at this     point, I will continue his medicine, although I will hold the     hydrochlorothiazide for now. 4. Hyperlipidemia.  Check fasting lipid panel.  I will continue with     home medication. 5. Depression and anxiety.  Continue with citalopram. 6. Chronic back pain.  Continue the Neurontin. 7. Thrombocytopenia more than likely from stray, but it is mild.  We     will watch him closely. 8. GERD.  Continue his PPIs.  Also, we may get PT, OT to see the     patient if needed.     Lonia Blood, M.D.     [name]  D:  12/22/2010  T:  12/22/2010  Job:  [id number]  Electronically Signed by Lonia Blood M.D. on 03/20/2011 02:41:32 PM

## 2011-06-21 ENCOUNTER — Other Ambulatory Visit: Payer: Self-pay | Admitting: Family Medicine

## 2011-07-04 ENCOUNTER — Other Ambulatory Visit: Payer: Self-pay | Admitting: Family Medicine

## 2011-07-08 ENCOUNTER — Encounter: Payer: Self-pay | Admitting: Family Medicine

## 2011-07-08 ENCOUNTER — Ambulatory Visit (INDEPENDENT_AMBULATORY_CARE_PROVIDER_SITE_OTHER): Payer: Medicare Other | Admitting: Family Medicine

## 2011-07-08 DIAGNOSIS — N529 Male erectile dysfunction, unspecified: Secondary | ICD-10-CM

## 2011-07-08 DIAGNOSIS — M549 Dorsalgia, unspecified: Secondary | ICD-10-CM

## 2011-07-08 DIAGNOSIS — G608 Other hereditary and idiopathic neuropathies: Secondary | ICD-10-CM

## 2011-07-08 DIAGNOSIS — E782 Mixed hyperlipidemia: Secondary | ICD-10-CM

## 2011-07-08 DIAGNOSIS — K5730 Diverticulosis of large intestine without perforation or abscess without bleeding: Secondary | ICD-10-CM

## 2011-07-08 DIAGNOSIS — F528 Other sexual dysfunction not due to a substance or known physiological condition: Secondary | ICD-10-CM

## 2011-07-08 DIAGNOSIS — Z125 Encounter for screening for malignant neoplasm of prostate: Secondary | ICD-10-CM

## 2011-07-08 DIAGNOSIS — K219 Gastro-esophageal reflux disease without esophagitis: Secondary | ICD-10-CM

## 2011-07-08 DIAGNOSIS — I10 Essential (primary) hypertension: Secondary | ICD-10-CM

## 2011-07-08 DIAGNOSIS — G4700 Insomnia, unspecified: Secondary | ICD-10-CM

## 2011-07-08 LAB — BASIC METABOLIC PANEL
BUN: 14 mg/dL (ref 6–23)
CO2: 31 mEq/L (ref 19–32)
Calcium: 9.1 mg/dL (ref 8.4–10.5)
Creatinine, Ser: 1 mg/dL (ref 0.4–1.5)

## 2011-07-08 LAB — CBC WITH DIFFERENTIAL/PLATELET
Eosinophils Relative: 1.3 % (ref 0.0–5.0)
HCT: 43.1 % (ref 39.0–52.0)
Lymphs Abs: 1.9 10*3/uL (ref 0.7–4.0)
MCV: 91 fl (ref 78.0–100.0)
Monocytes Absolute: 0.4 10*3/uL (ref 0.1–1.0)
Neutro Abs: 5.3 10*3/uL (ref 1.4–7.7)
Platelets: 168 10*3/uL (ref 150.0–400.0)
RDW: 13.3 % (ref 11.5–14.6)
WBC: 7.7 10*3/uL (ref 4.5–10.5)

## 2011-07-08 LAB — POCT URINALYSIS DIPSTICK
Protein, UA: NEGATIVE
Spec Grav, UA: 1.02
Urobilinogen, UA: 0.2
pH, UA: 7.5

## 2011-07-08 LAB — TSH: TSH: 6.03 u[IU]/mL — ABNORMAL HIGH (ref 0.35–5.50)

## 2011-07-08 MED ORDER — CITALOPRAM HYDROBROMIDE 20 MG PO TABS: 20.0000 mg | ORAL_TABLET | Freq: Every day | ORAL | Status: DC

## 2011-07-08 MED ORDER — OMEPRAZOLE 40 MG PO CPDR: 40.0000 mg | DELAYED_RELEASE_CAPSULE | Freq: Every day | ORAL | Status: DC

## 2011-07-08 MED ORDER — GABAPENTIN 800 MG PO TABS: 800.0000 mg | ORAL_TABLET | Freq: Two times a day (BID) | ORAL | Status: DC

## 2011-07-08 MED ORDER — SIMVASTATIN 20 MG PO TABS: 20.0000 mg | ORAL_TABLET | Freq: Every day | ORAL | Status: DC

## 2011-07-08 MED ORDER — ZOLPIDEM TARTRATE 5 MG PO TABS: ORAL_TABLET | ORAL | Status: DC

## 2011-07-08 MED ORDER — VARDENAFIL HCL 10 MG PO TABS: 10.0000 mg | ORAL_TABLET | ORAL | Status: DC | PRN

## 2011-07-08 MED ORDER — LISINOPRIL 5 MG PO TABS: 5.0000 mg | ORAL_TABLET | Freq: Every day | ORAL | Status: DC

## 2011-07-08 NOTE — Progress Notes (Signed)
  Subjective:    Patient ID: Kurt Parsons, male    DOB: 06/29/1945, 66 y.o.   MRN: [id number]  HPI [name] is a delightful 66 year old married male nonsmoker who comes in today for Medicare wellness examination because of a history of mild depression, chronic back pain, hypertension, reflux esophagitis, hyperlipidemia, and sleep dysfunction  His medications reviewed in detail there've been no changes I would recommend that he decrease the Ambien to 5 mg and only take a half a tablet each bedtime when necessary  He had a cardiac evaluation in September because of a syncopal episode cardiac workup was normal.  He gets routine eye care, dental care, colonoscopy normal, tetanus 2007, Pneumovax 2012, seasonal flu shot 2012.  Cognitive function normal he continues to work on a regular basis. Home health safety reviewed no issues identified, no guns in the house there and he does have a health care power of attorney and living will   Review of Systems  Constitutional: Negative.   HENT: Negative.   Eyes: Negative.   Respiratory: Negative.   Cardiovascular: Negative.   Gastrointestinal: Negative.   Genitourinary: Negative.   Musculoskeletal: Negative.   Skin: Negative.   Neurological: Negative.   Hematological: Negative.   Psychiatric/Behavioral: Negative.    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 He appears well-developed and well-nourished.  HENT:  Head: Normocephalic and atraumatic.  Right Ear: External ear normal.  Left Ear: External ear normal.  Nose: Nose normal.  Mouth/Throat: Oropharynx is clear and moist.  Eyes: Conjunctivae and EOM are normal. Pupils are equal, round, and reactive to light.  Neck: Normal range of motion. Neck supple. No JVD present. No tracheal deviation present. No thyromegaly present.  Cardiovascular: Normal rate, regular  rhythm, normal heart sounds and intact distal pulses.  Exam reveals no gallop and no friction rub.   No murmur heard. Pulmonary/Chest: Effort normal and breath sounds normal. No stridor. No respiratory distress. He has no wheezes. He has no rales. He exhibits no tenderness.  Abdominal: Soft. Bowel sounds are normal. He exhibits no distension and no mass. There is no tenderness. There is no rebound and no guarding.  Genitourinary: Rectum normal, prostate normal and penis normal. Guaiac negative stool. No penile tenderness.  Musculoskeletal: Normal range of motion. He exhibits no edema and no tenderness.  Lymphadenopathy:    He has no cervical adenopathy.  Neurological: He is alert and oriented to person, place, and time. He has normal reflexes. No cranial nerve deficit. He exhibits normal muscle tone.  Skin: Skin is warm and dry. No rash noted. No erythema. No pallor.  Psychiatric: He has a normal mood and affect. His behavior is normal. Judgment and thought content normal.          Assessment & Plan:  Healthy male  History of mild depression continue Celexa 20 mg each bedtime  Chronic back pain Neurontin 800 mg twice a day  Mild hypertension continue lisinopril 5 mg daily  Reflux esophagitis continue Prilosec 40 daily  History of hyperlipidemia continue Zocor 20 daily and an aspirin tablet  Sleep dysfunction decrease Ambien to 2.5 mg each bedtime when necessary  Return in one year or sooner if any problems

## 2012-01-04 IMAGING — CT CT HEAD W/O CM
1 series · 16 of 30 positions shown, 20 images · non-contrast
Comparison: None.

CLINICAL DATA: Altered mental status.

CT HEAD WITHOUT CONTRAST
TECHNIQUE: Contiguous axial images were obtained from the base of
the skull through the vertex without contrast.

[Series 2: head routine 4.8 h37s · axial · 0.43mm/px · z∈[-178,-21]mm · 16 of 36 slices shown, 20 images]
[im 2/36  brain]
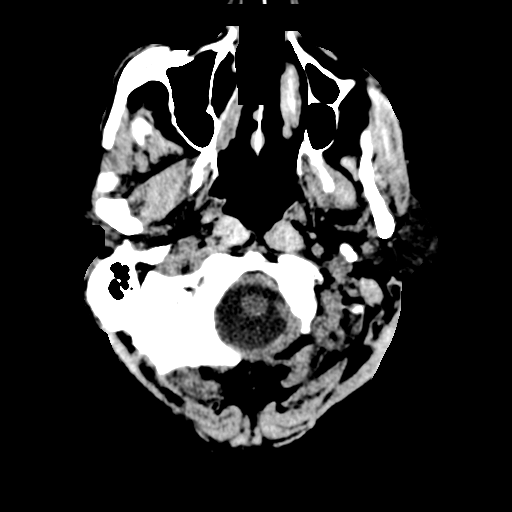
[im 2/36  bone]
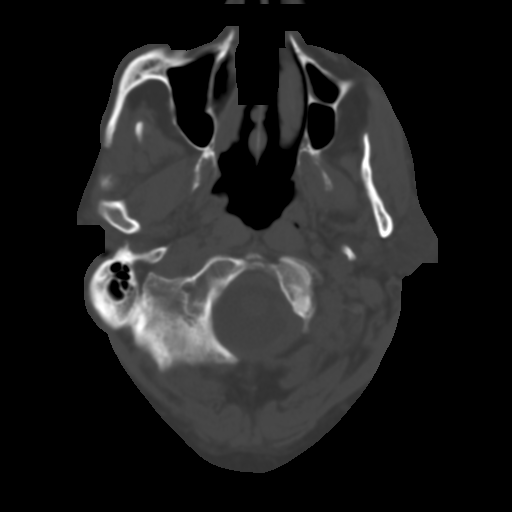
[im 4/36  brain]
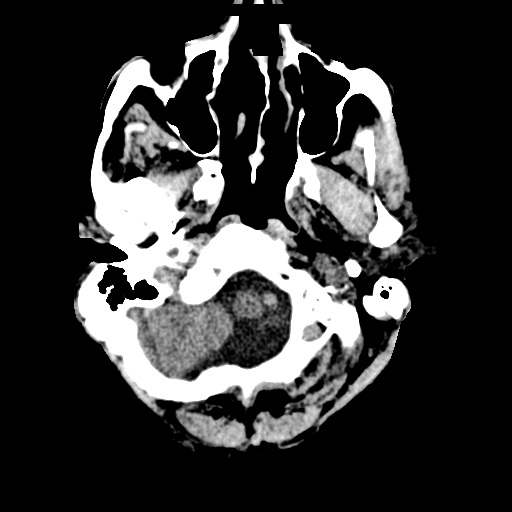
[im 7/36  brain]
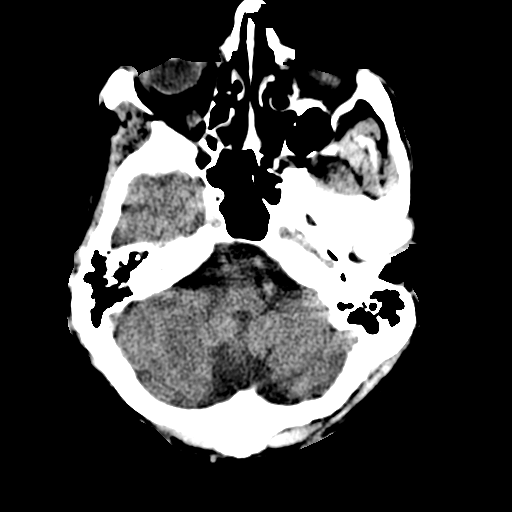
[im 9/36  brain]
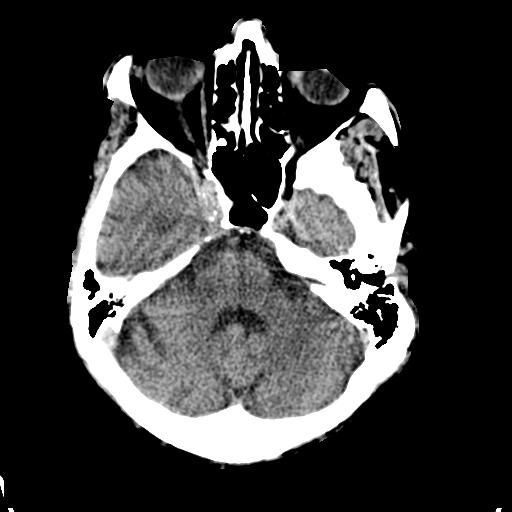
[im 10/36  brain]
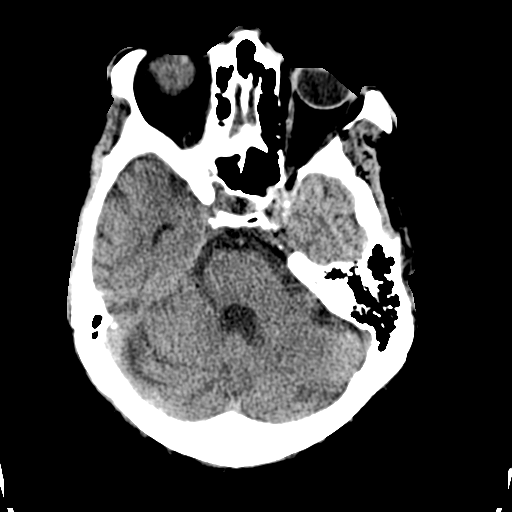
[im 10/36  bone]
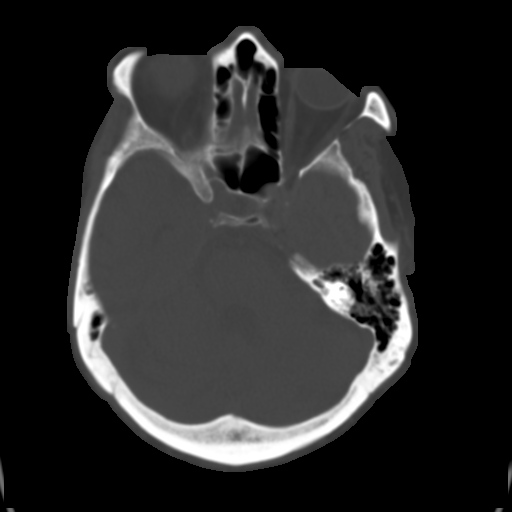
[im 13/36  brain]
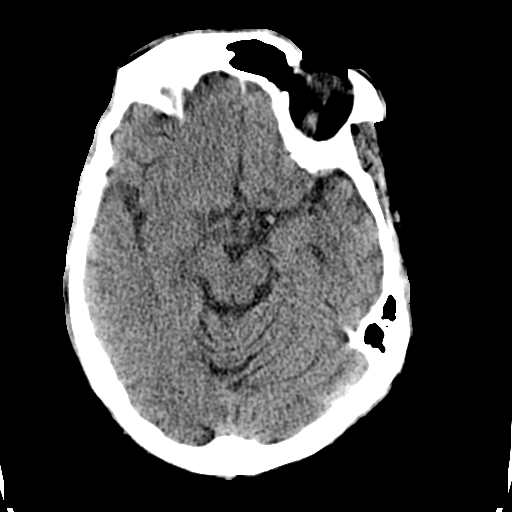
[im 15/36  brain]
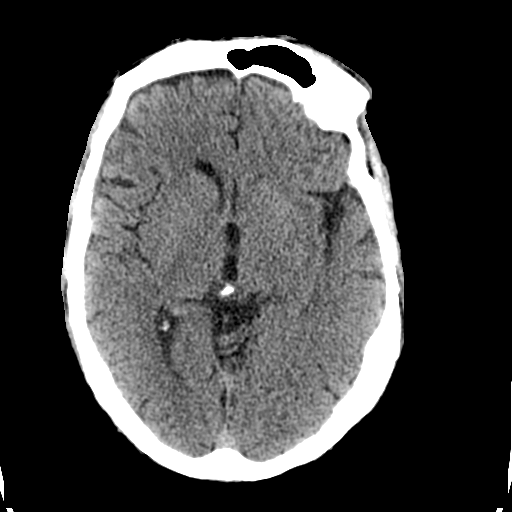
[im 17/36  brain]
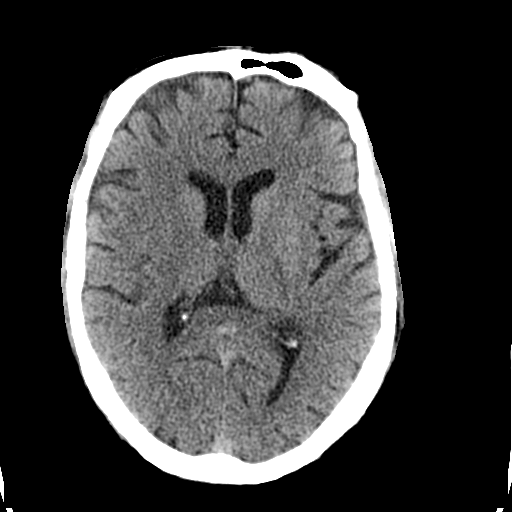
[im 19/36  brain]
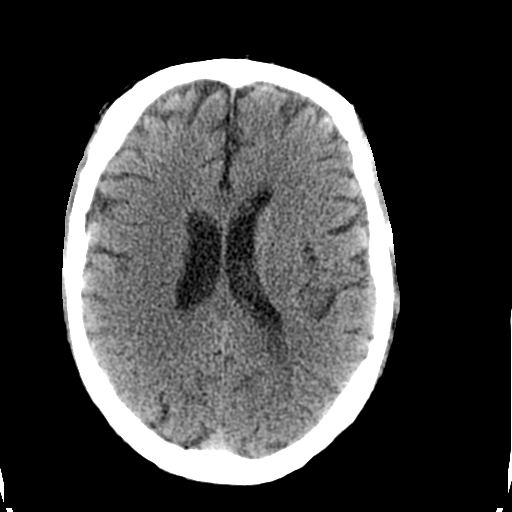
[im 19/36  bone]
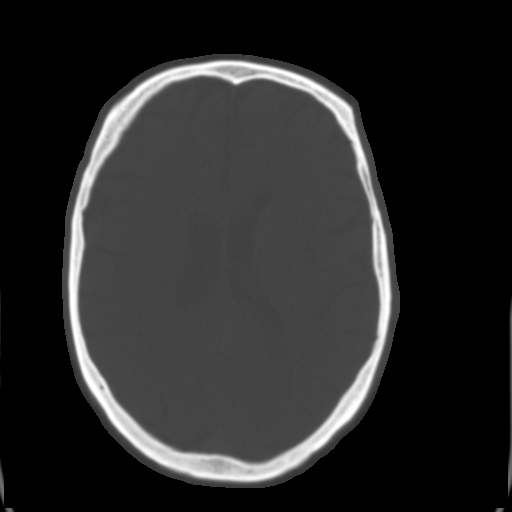
[im 21/36  brain]
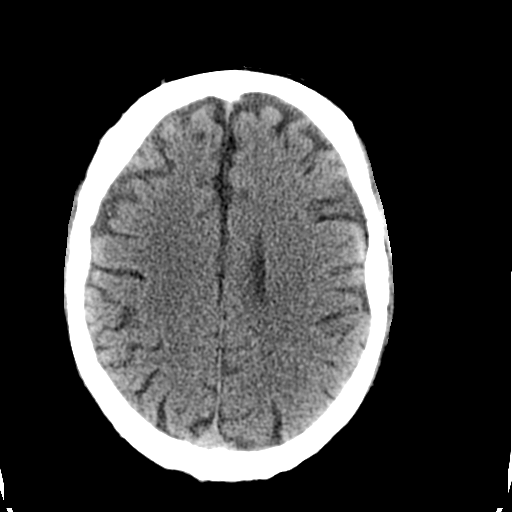
[im 23/36  brain]
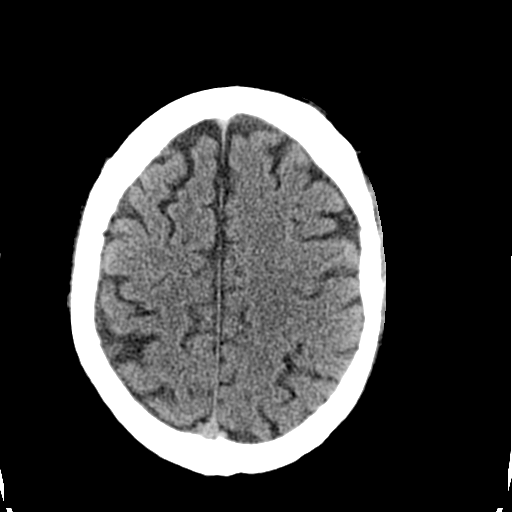
[im 26/36  brain]
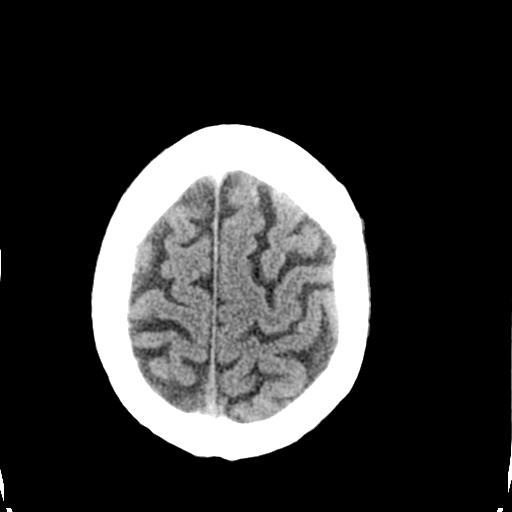
[im 27/36  brain]
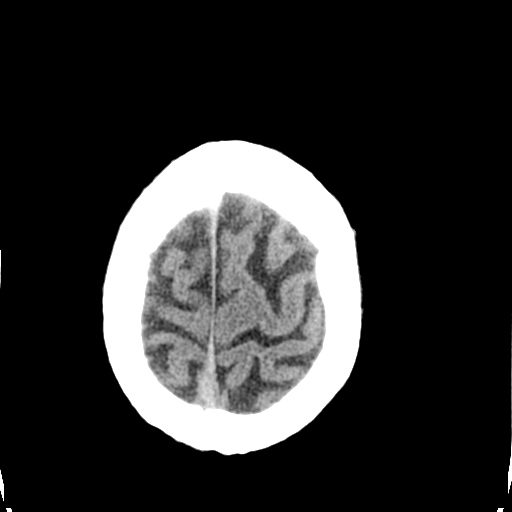
[im 27/36  bone]
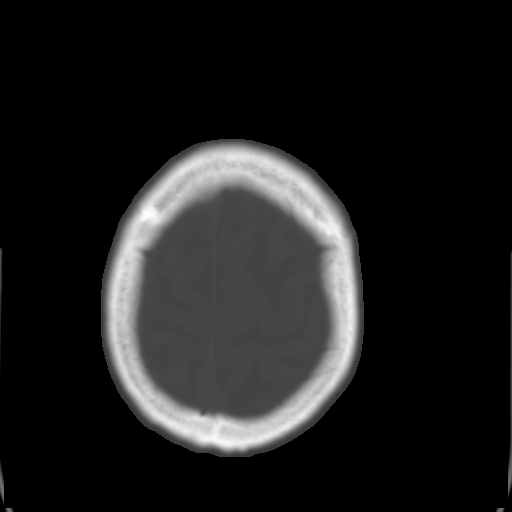
[im 29/36  brain]
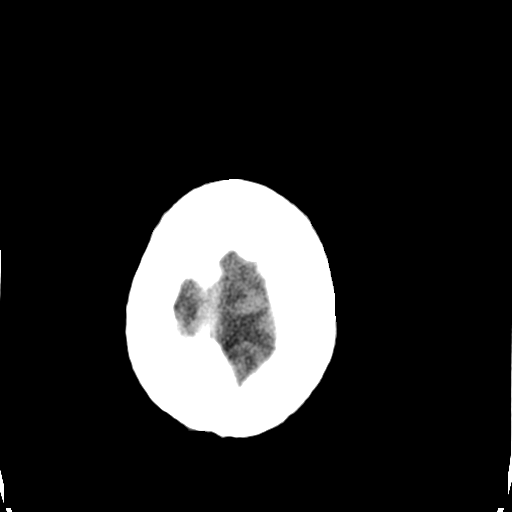
[im 32/36  brain]
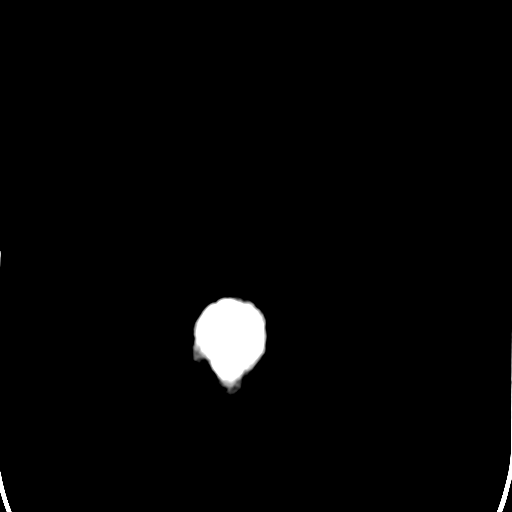
[im 34/36  brain]
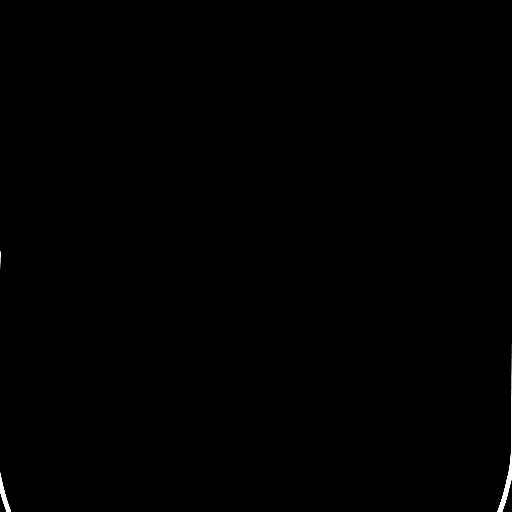

[16 of 30 positions shown; findings below may reference images not displayed]

FINDINGS: No intracranial hemorrhage.

No CT evidence of large acute infarct.  Small acute infarct cannot
be excluded by CT.

No intracranial mass lesion detected on this unenhanced exam.

Global mild atrophy without hydrocephalus.

Visualized sinuses and mastoid air cells are clear.
IMPRESSION: No intracranial hemorrhage or CT evidence of large acute infarct.

## 2012-01-04 IMAGING — CR DG CHEST 1V
1 series · 1 of 1 positions shown · non-contrast
Comparison: 01/12/2010, 05/11/2005 and 09/16/2004.

CLINICAL DATA: Altered mental status.

CHEST - 1 VIEW

[x chest ap]
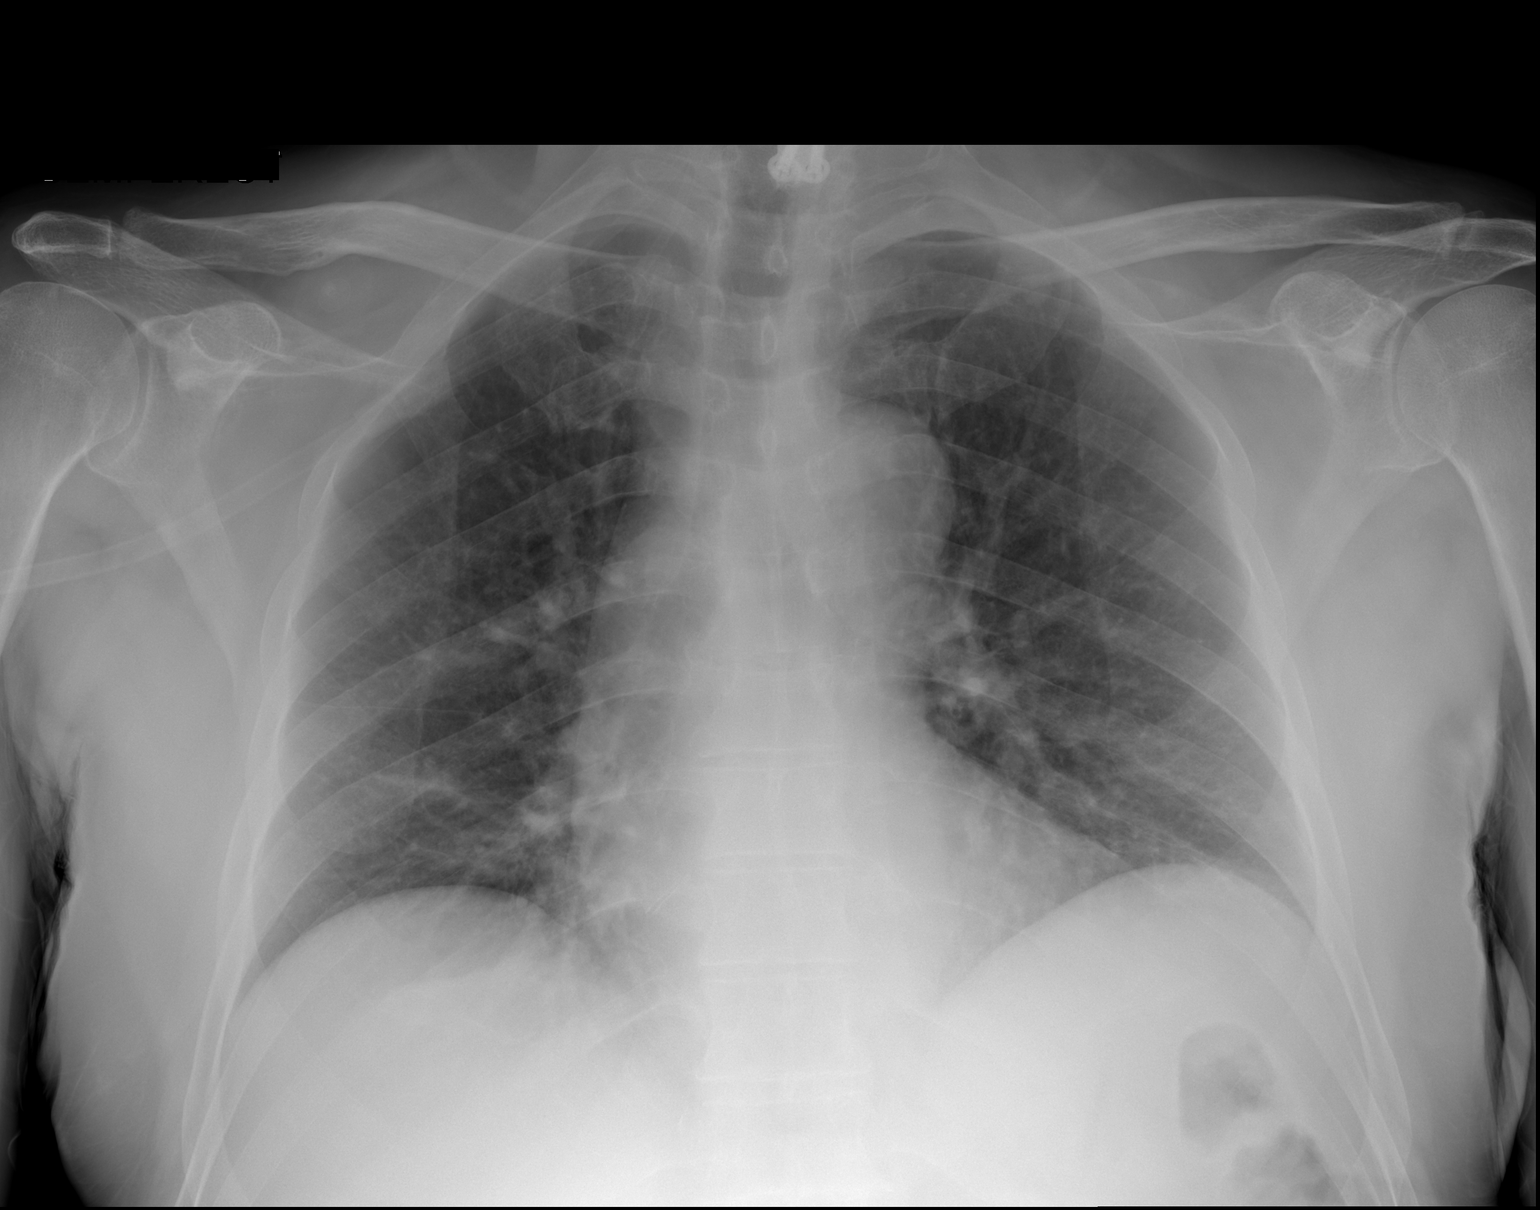

[1 of 1 positions shown; findings below may reference images not displayed]

FINDINGS: Sub centimeter nodule projecting over the anterior aspect
of the left first rib is unchanged from 8880.  This may be a bone
island in the rib or granuloma.

Mildly tortuous aorta.  Cardiomegaly.  Central pulmonary vascular
prominence.  No frank pulmonary edema.  Right base subsegmental
atelectasis.  No segmental consolidation.  No gross pneumothorax.
IMPRESSION: Poor inspiration with mild cardiomegaly and central pulmonary
vessel prominence.

Mildly tortuous aorta.

Right base subsegmental atelectasis.

Sclerotic focus left lung apex unchanged as noted above.

## 2012-04-14 ENCOUNTER — Ambulatory Visit: Payer: Medicare Other | Admitting: *Deleted

## 2012-04-24 ENCOUNTER — Ambulatory Visit (INDEPENDENT_AMBULATORY_CARE_PROVIDER_SITE_OTHER): Payer: Medicare Other | Admitting: Family Medicine

## 2012-04-24 ENCOUNTER — Encounter: Payer: Self-pay | Admitting: Family Medicine

## 2012-04-24 VITALS — BP 120/80 | Temp 99.4°F

## 2012-04-24 DIAGNOSIS — R197 Diarrhea, unspecified: Secondary | ICD-10-CM

## 2012-04-24 MED ORDER — HYDROCODONE-HOMATROPINE 5-1.5 MG/5ML PO SYRP: 5.0000 mL | ORAL_SOLUTION | Freq: Three times a day (TID) | ORAL | Status: DC | PRN

## 2012-04-24 NOTE — Patient Instructions (Signed)
Rest at home  Clear liquid diet  Tylenol 2 tabs 3 times a day when necessary for fever and chills  Hydromet 1/2-1 teaspoon 3 times daily for diarrhea  Return when necessary

## 2012-04-24 NOTE — Progress Notes (Signed)
  Subjective:    Patient ID: Kurt Parsons, male    DOB: [birth year], 66 y.o.   MRN: [id number]  HPI Kurt Parsons is a 66 year old married male nonsmoker who comes in today with a 48-hour history of fever or chills abdominal cramps general myalgias and diarrhea. No vomiting.  He has not been around anybody who has been ill.  He's not had a history of any GI problems in the past. Had a colonoscopy a couple years ago which showed some diverticulosis   Review of Systems GI review of systems otherwise negative    Objective:   Physical Exam  Well-developed well-nourished male in no acute distress examination the abdomen the abdomen appears to be normal there are some hyperactive bowel sounds throughout all 4 quadrants. Diffusely tender no point tenderness no rebound      Assessment & Plan:  Viral diarrhea plan bed rest liquids Tylenol

## 2012-04-27 ENCOUNTER — Ambulatory Visit (INDEPENDENT_AMBULATORY_CARE_PROVIDER_SITE_OTHER): Payer: Medicare Other | Admitting: Family Medicine

## 2012-04-27 ENCOUNTER — Encounter: Payer: Self-pay | Admitting: Family Medicine

## 2012-04-27 VITALS — BP 140/90 | Temp 97.7°F | Wt 192.0 lb

## 2012-04-27 DIAGNOSIS — R197 Diarrhea, unspecified: Secondary | ICD-10-CM

## 2012-04-27 NOTE — Patient Instructions (Signed)
Advance her diet slowly as we discussed. Avoid anything with nuts or seeds  Finish the Cipro return when necessary

## 2012-04-27 NOTE — Progress Notes (Signed)
  Subjective:    Patient ID: Kurt Parsons, male    DOB: 10-06-1945, 66 y.o.   MRN: [id number]  HPI Kurt Parsons is a 66 year old male who comes in today for followup of diarrhea  Has noticed in his previous notes he was seen here for evaluation of diarrhea  He was seen here earlier in the week exam was negative he was felt to have a viral syndrome however he has a history of diverticulosis however he's never had diverticulitis.  We treated him symptomatically but after 24 hours she seemed to be getting worse and was having a lot of chills. We therefore. We started him on Cipro and Flagyl. He got nauseated from the Flagyl so we stopped that yesterday he continues to take the Cipro 1 twice daily  Today he says he feels much much better no fever chills no diarrhea.    Review of Systems     general review of systems otherwise negative  Objective:   Physical Exam  well-developed well-nourished male in no acute distress examination the abdomen shows no tenderness no rebound no masses       Assessment & Plan:   viral syndrome question superimposed diverticulitis plan finish Cipro return when necessary advance diet slowly

## 2012-07-10 ENCOUNTER — Encounter: Payer: Self-pay | Admitting: Family Medicine

## 2012-07-10 ENCOUNTER — Ambulatory Visit (INDEPENDENT_AMBULATORY_CARE_PROVIDER_SITE_OTHER): Payer: Medicare Other | Admitting: Family Medicine

## 2012-07-10 VITALS — BP 148/90 | Temp 98.5°F | Ht 70.25 in | Wt 200.0 lb

## 2012-07-10 DIAGNOSIS — I10 Essential (primary) hypertension: Secondary | ICD-10-CM

## 2012-07-10 DIAGNOSIS — M549 Dorsalgia, unspecified: Secondary | ICD-10-CM

## 2012-07-10 DIAGNOSIS — G608 Other hereditary and idiopathic neuropathies: Secondary | ICD-10-CM

## 2012-07-10 DIAGNOSIS — R6889 Other general symptoms and signs: Secondary | ICD-10-CM

## 2012-07-10 DIAGNOSIS — R7989 Other specified abnormal findings of blood chemistry: Secondary | ICD-10-CM

## 2012-07-10 DIAGNOSIS — K219 Gastro-esophageal reflux disease without esophagitis: Secondary | ICD-10-CM

## 2012-07-10 DIAGNOSIS — F528 Other sexual dysfunction not due to a substance or known physiological condition: Secondary | ICD-10-CM

## 2012-07-10 DIAGNOSIS — G4700 Insomnia, unspecified: Secondary | ICD-10-CM

## 2012-07-10 DIAGNOSIS — R197 Diarrhea, unspecified: Secondary | ICD-10-CM

## 2012-07-10 DIAGNOSIS — E782 Mixed hyperlipidemia: Secondary | ICD-10-CM

## 2012-07-10 LAB — CBC WITH DIFFERENTIAL/PLATELET
Basophils Relative: 0.4 % (ref 0.0–3.0)
Eosinophils Absolute: 0.2 10*3/uL (ref 0.0–0.7)
Eosinophils Relative: 2.4 % (ref 0.0–5.0)
Hemoglobin: 14.7 g/dL (ref 13.0–17.0)
MCHC: 33.9 g/dL (ref 30.0–36.0)
MCV: 90.5 fl (ref 78.0–100.0)
Monocytes Absolute: 0.4 10*3/uL (ref 0.1–1.0)
Neutro Abs: 4 10*3/uL (ref 1.4–7.7)
RBC: 4.79 Mil/uL (ref 4.22–5.81)
WBC: 6.5 10*3/uL (ref 4.5–10.5)

## 2012-07-10 LAB — LIPID PANEL
LDL Cholesterol: 94 mg/dL (ref 0–99)
Total CHOL/HDL Ratio: 3
VLDL: 19 mg/dL (ref 0.0–40.0)

## 2012-07-10 LAB — POCT URINALYSIS DIPSTICK
Blood, UA: NEGATIVE
Glucose, UA: NEGATIVE
Ketones, UA: NEGATIVE
Protein, UA: NEGATIVE
Spec Grav, UA: 1.025
Urobilinogen, UA: 0.2

## 2012-07-10 LAB — BASIC METABOLIC PANEL
CO2: 30 mEq/L (ref 19–32)
Calcium: 9.1 mg/dL (ref 8.4–10.5)
Chloride: 108 mEq/L (ref 96–112)
Glucose, Bld: 90 mg/dL (ref 70–99)
Sodium: 144 mEq/L (ref 135–145)

## 2012-07-10 LAB — HEPATIC FUNCTION PANEL
Alkaline Phosphatase: 76 U/L (ref 39–117)
Bilirubin, Direct: 0.1 mg/dL (ref 0.0–0.3)
Total Bilirubin: 0.9 mg/dL (ref 0.3–1.2)

## 2012-07-10 LAB — TSH: TSH: 2.64 u[IU]/mL (ref 0.35–5.50)

## 2012-07-10 MED ORDER — LISINOPRIL 5 MG PO TABS: 5.0000 mg | ORAL_TABLET | Freq: Every day | ORAL | Status: DC

## 2012-07-10 MED ORDER — CITALOPRAM HYDROBROMIDE 20 MG PO TABS: 20.0000 mg | ORAL_TABLET | Freq: Every day | ORAL | Status: DC

## 2012-07-10 MED ORDER — SIMVASTATIN 20 MG PO TABS: 20.0000 mg | ORAL_TABLET | Freq: Every day | ORAL | Status: DC

## 2012-07-10 MED ORDER — OMEPRAZOLE 40 MG PO CPDR: 40.0000 mg | DELAYED_RELEASE_CAPSULE | Freq: Every day | ORAL | Status: DC

## 2012-07-10 MED ORDER — VARDENAFIL HCL 10 MG PO TABS: 10.0000 mg | ORAL_TABLET | ORAL | Status: DC | PRN

## 2012-07-10 MED ORDER — ZOLPIDEM TARTRATE 5 MG PO TABS: ORAL_TABLET | ORAL | Status: DC

## 2012-07-10 NOTE — Progress Notes (Signed)
  Subjective:    Patient ID: Kurt Parsons, male    DOB: March 21, 1946, 67 y.o.   MRN: [id number]  HPI  [name] is a 67 year old married male nonsmoker who comes in today for a Medicare wellness examination because of a history of mild depression, neuropathy, hypertension, reflux esophagitis, hyperlipidemia, erectile dysfunction, and sleep dysfunction  His medications reviewed the been no changes.  Vaccinations up-to-date except he needs a shingles vaccine........ information given  He gets routine eye care,,,,,, recent right cataract extraction with lens implant that was on his lazy eye however it has help with some light perception,,,,,,,,,,,,, regular dental care, colonoscopy 5 years normal except for some diverticuli.  Cognitive function normal he walks on a regular basis home health safety reviewed no issues identified, no guns in the house, he does have a health care power of attorney and living well  Review of Systems  Constitutional: Negative.   HENT: Negative.   Eyes: Negative.   Respiratory: Negative.   Cardiovascular: Negative.   Gastrointestinal: Negative.   Genitourinary: Negative.   Musculoskeletal: Negative.   Skin: Negative.   Neurological: Negative.   Hematological: Negative.   Psychiatric/Behavioral: Negative.        Objective:   Physical Exam  Constitutional: He is oriented to person, place, and time. He appears well-developed and well-nourished.  HENT:  Head: Normocephalic and atraumatic.  Right Ear: External ear normal.  Left Ear: External ear normal.  Nose: Nose normal.  Mouth/Throat: Oropharynx is clear and moist.  Eyes: Conjunctivae normal and EOM are normal. Pupils are equal, round, and reactive to light.  Neck: Normal range of motion. Neck supple. No JVD present. No tracheal deviation present. No thyromegaly present.  Cardiovascular: Normal rate, regular rhythm, normal heart sounds and intact distal pulses.  Exam reveals no gallop and no friction rub.   No  murmur heard. Pulmonary/Chest: Effort normal and breath sounds normal. No stridor. No respiratory distress. He has no wheezes. He has no rales. He exhibits no tenderness.  Abdominal: Soft. Bowel sounds are normal. He exhibits no distension and no mass. There is no tenderness. There is no rebound and no guarding.  Genitourinary: Rectum normal, prostate normal and penis normal. Guaiac negative stool. No penile tenderness.  Musculoskeletal: Normal range of motion. He exhibits no edema and no tenderness.  Lymphadenopathy:    He has no cervical adenopathy.  Neurological: He is alert and oriented to person, place, and time. He has normal reflexes. No cranial nerve deficit. He exhibits normal muscle tone.  Skin: Skin is warm and dry. No rash noted. No erythema. No pallor.       Total body skin exam normal except for scar posterior cervical area from previous neck surgery  Psychiatric: He has a normal mood and affect. His behavior is normal. Judgment and thought content normal.          Assessment & Plan:  Healthy male  History of mild depression continue Celexa 20 mg each bedtime  Chronic pain Neurontin 800 mg twice daily  Hyperlipidemia continue Zocor 20 mg daily and an aspirin tablet  Hypertension continue lisinopril 5 mg daily...Marland KitchenMarland KitchenMarland Kitchen but since blood pressure not at goal 140/90 today. BP check daily and followup in 3 weeks  Reflux esophagitis continue Prilosec 40 mg daily  Erectile dysfunction Levitra 10 mg when necessary  Sleep dysfunction Ambien 5 mg dose one half tab each bedtime when necessary  Status post cataract surgery right eye 2013

## 2012-07-10 NOTE — Patient Instructions (Signed)
Continue your current medications  Check your blood pressure daily in the morning  Return in 3 weeks with the data and the device...........Marland Kitchen blood pressure today 140/90,,,,,,,,,,, goal of your blood pressure should be 135/85 or less  Remember to go for a 30 minute walk daily and avoid salt

## 2012-07-31 ENCOUNTER — Encounter: Payer: Self-pay | Admitting: Family Medicine

## 2012-07-31 ENCOUNTER — Ambulatory Visit (INDEPENDENT_AMBULATORY_CARE_PROVIDER_SITE_OTHER): Payer: Medicare Other | Admitting: Family Medicine

## 2012-07-31 VITALS — BP 140/80 | Temp 98.8°F | Wt 201.0 lb

## 2012-07-31 DIAGNOSIS — I10 Essential (primary) hypertension: Secondary | ICD-10-CM

## 2012-07-31 MED ORDER — LISINOPRIL 10 MG PO TABS: 10.0000 mg | ORAL_TABLET | Freq: Every day | ORAL | Status: DC

## 2012-07-31 NOTE — Patient Instructions (Addendum)
Increase the lisinopril to 10 mg daily  Check a blood pressure daily in the morning  Return in one month for followup

## 2012-07-31 NOTE — Progress Notes (Signed)
  Subjective:    Patient ID: Kurt Parsons, male    DOB: [birth year], 67 y.o.   MRN: [id number]  HPI Kurt Parsons is a 67 year old married male nonsmoker who comes in today for followup of hypertension  His blood pressure is averaging 140-150/systolic and 85-90 diastolic on lisinopril 5 mg daily. No episodes of lightheadedness  Review of systems otherwise negative   Review of Systems    review of systems otherwise negative Objective:   Physical Exam  Well-developed well-nourished male no acute distress BP right arm sitting position 140/80      Assessment & Plan:  Hypertension almost at goal increase lisinopril to 10 mg daily BP check daily in the morning followup in one month when necessary

## 2012-08-28 ENCOUNTER — Other Ambulatory Visit: Payer: Self-pay | Admitting: Family Medicine

## 2012-08-29 ENCOUNTER — Ambulatory Visit: Payer: Medicare Other | Admitting: Family Medicine

## 2012-10-02 ENCOUNTER — Ambulatory Visit (INDEPENDENT_AMBULATORY_CARE_PROVIDER_SITE_OTHER): Payer: Medicare Other | Admitting: Family Medicine

## 2012-10-02 ENCOUNTER — Encounter: Payer: Self-pay | Admitting: Family Medicine

## 2012-10-02 VITALS — BP 130/90 | Temp 98.7°F | Wt 200.0 lb

## 2012-10-02 DIAGNOSIS — K602 Anal fissure, unspecified: Secondary | ICD-10-CM

## 2012-10-02 DIAGNOSIS — I10 Essential (primary) hypertension: Secondary | ICD-10-CM

## 2012-10-02 DIAGNOSIS — K5900 Constipation, unspecified: Secondary | ICD-10-CM | POA: Insufficient documentation

## 2012-10-02 MED ORDER — HYDROCORTISONE ACETATE 25 MG RE SUPP: 25.0000 mg | Freq: Two times a day (BID) | RECTAL | Status: DC

## 2012-10-02 NOTE — Progress Notes (Signed)
  Subjective:    Patient ID: Kurt Parsons, male    DOB: May 25, 1946, 67 y.o.   MRN: [id number]  HPI Kurt Parsons is a 67 year old married male nonsmoker who comes in today for evaluation of 2 problems  He has hypertension and we have been adjusting his medication. He's currently on lisinopril 5 mg daily. BP at home 160/100 BP here 130/90. I asked him to get a new blood pressure cuff I think his cuff is not functioning properly.  He states on Saturday he was constipated had to have a bowel movement couldn't go. He fell it took a fleets enema. He then had severe rectal pain. No bleeding. Colonoscopy 5 years ago normal.   Review of Systems Review of systems otherwise negative     Objective:   Physical Exam Well-developed well-nourished male no acute distress BP left sitting position 130/90  Examination of the rectum was normal except for a fissure at the 6:00 position. Digital exam shows a normal prostate stool normal soft guaiac negative       Assessment & Plan:  Hypertension question at goal purchase new cuff return in 2 weeks  Constipation with rectal fissure outlined program

## 2012-10-02 NOTE — Patient Instructions (Addendum)
Purchase a new blood pressure cuff............omron digital pump up blood pressure cuff  Check your blood pressure daily in the morning  Return in 2 weeks with all your blood pressure readings and the new device  Milk of magnesia 2 tablespoons twice daily  Drink 32 ounces of water daily  Walk 30 minutes daily  Anusol suppositories,,,,,,,,,, 1 nightly at bedtime for 12 nights

## 2012-10-17 ENCOUNTER — Ambulatory Visit (INDEPENDENT_AMBULATORY_CARE_PROVIDER_SITE_OTHER): Payer: Medicare Other | Admitting: Family Medicine

## 2012-10-17 ENCOUNTER — Encounter: Payer: Self-pay | Admitting: Family Medicine

## 2012-10-17 VITALS — BP 130/90 | Temp 99.4°F | Wt 201.0 lb

## 2012-10-17 DIAGNOSIS — Z23 Encounter for immunization: Secondary | ICD-10-CM

## 2012-10-17 DIAGNOSIS — R1031 Right lower quadrant pain: Secondary | ICD-10-CM

## 2012-10-17 DIAGNOSIS — G8929 Other chronic pain: Secondary | ICD-10-CM | POA: Insufficient documentation

## 2012-10-17 DIAGNOSIS — I10 Essential (primary) hypertension: Secondary | ICD-10-CM

## 2012-10-17 LAB — BASIC METABOLIC PANEL
BUN: 12 mg/dL (ref 6–23)
Chloride: 105 mEq/L (ref 96–112)
Creatinine, Ser: 1.2 mg/dL (ref 0.4–1.5)
Glucose, Bld: 77 mg/dL (ref 70–99)
Potassium: 3.8 mEq/L (ref 3.5–5.1)

## 2012-10-17 NOTE — Progress Notes (Signed)
  Subjective:    Patient ID: Kurt Parsons, male    DOB: [birth year], 67 y.o.   MRN: [id number]  HPI [name] is a 67 year old married male nonsmoker who comes in today for evaluation of 2 problems  He's currently on lisinopril 10 mg daily BP right arm sitting position by me 130/80  His digital cuff is operating well except he can't get that tight around his arm. I think that's why he's getting elevated blood pressure readings at home.  He states for the past 4 months she's had right lower quadrant abdominal pain. He says the pain comes and goes. It hurts when he coughs or walks. He's had no fever chills nausea vomiting diarrhea change in bowel habits or urinary habits. He had a colonoscopy about 5 years ago it was normal. He says he has night sweats but the night sweats have been present for many years not just since the right lower quadrant abdominal pain started.   Review of Systems    GI review of systems otherwise negative Objective:   Physical Exam Well-developed well-nourished male no acute distress examination of the abdomen shows he hadn't be soft the bowel sounds are normal liver spleen kidneys not enlarged. I can palpate no masses. There is tenderness in the right lower quadrant I can appreciate no masses or hernia. Genital exam normal no hernia.  Blood pressure right arm sitting position 130/80       Assessment & Plan:  Hypertension at goal continue lisinopril 1 daily  Right lower quadrant intermittent abdominal pain for 4 months question hernia???????? Plan GI consult

## 2012-10-17 NOTE — Patient Instructions (Signed)
Continue lisinopril 1 tablet daily in the morning  Check your blood pressure once daily in the morning on Sunday morning  Call you gastroenterologist and make an appointment to have him check your abdomen

## 2012-10-23 ENCOUNTER — Telehealth: Payer: Self-pay | Admitting: Internal Medicine

## 2012-10-23 NOTE — Telephone Encounter (Signed)
Spoke with patient and he is having constipation and abdominal pain. He saw his PCP and was told to f/u with Dr. Juanda Chance. He states his PCP thought he might have a hernia. Scheduled patient on 10/24/12 at 9:00 AM with Mike Gip, PA.

## 2012-10-24 ENCOUNTER — Encounter: Payer: Self-pay | Admitting: Physician Assistant

## 2012-10-24 ENCOUNTER — Ambulatory Visit (INDEPENDENT_AMBULATORY_CARE_PROVIDER_SITE_OTHER): Payer: Medicare Other | Admitting: Physician Assistant

## 2012-10-24 ENCOUNTER — Encounter: Payer: Self-pay | Admitting: Internal Medicine

## 2012-10-24 VITALS — BP 110/70 | HR 60 | Ht 71.0 in | Wt 198.5 lb

## 2012-10-24 DIAGNOSIS — K921 Melena: Secondary | ICD-10-CM

## 2012-10-24 DIAGNOSIS — K5900 Constipation, unspecified: Secondary | ICD-10-CM

## 2012-10-24 DIAGNOSIS — R1031 Right lower quadrant pain: Secondary | ICD-10-CM

## 2012-10-24 MED ORDER — MOVIPREP 100 G PO SOLR: 1.0000 | Freq: Once | ORAL | Status: DC

## 2012-10-24 NOTE — Progress Notes (Signed)
I agree with the plans, could have CT scan first if he has to wait for colon too long.

## 2012-10-24 NOTE — Progress Notes (Signed)
Subjective:    Patient ID: Kurt Parsons, male    DOB: 1946-04-05, 67 y.o.   MRN: [id number]  HPI  Kurt Parsons is a very nice 67 year old male known to Dr. [name] from prior procedures. 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 He had at the time of last EGD 60-70% improvement in his obstruction. He had colonoscopy in 2005 showing mild diverticulosis in the left colon. He also has hypertension, hyperlipidemia, chronic lower extremity neuropathy. He comes in today with complaints of new onset of constipation over the past couple of months. He had been seen by Dr. Tawanna Cooler and is now taking Colace and MiraLax on a daily basis. He says with that  he is still only having a bowel movement every 3-4 days. He says he intermittently is seeing small amounts of bright red blood mixed in with his bowel movements. He has also noticed a pain in his right lower quadrant over the past couple of months which seems to be more prominent and sometime sharp with lifting something heavy. He says his appetite has been fine his weight has been stable. He has had no recent changes in his diet or his usual medication regimen. Family history is negative for colon cancer        Review of Systems  Constitutional: Negative.   HENT: Negative.   Eyes: Negative.   Respiratory: Negative.   Gastrointestinal: Positive for abdominal pain and constipation.  Endocrine: Negative.   Genitourinary: Negative.   Musculoskeletal: Negative.   Skin: Negative.   Allergic/Immunologic: Negative.   Neurological: Negative.   Hematological: Negative.   Psychiatric/Behavioral: Negative.    Outpatient Prescriptions Prior to Visit  Medication Sig Dispense Refill  . aspirin EC 81 MG tablet Take 1 tablet (81 mg total) by mouth daily.      . citalopram (CELEXA) 20 MG tablet Take 1 tablet (20 mg total) by mouth daily.  100 tablet  3   . gabapentin (NEURONTIN) 800 MG tablet Take 1 tablet (800 mg total) by mouth 2 (two) times daily.  200 tablet  3  . lisinopril (PRINIVIL,ZESTRIL) 10 MG tablet Take 1 tablet (10 mg total) by mouth daily.  90 tablet  3  . omeprazole (PRILOSEC) 40 MG capsule Take 1 capsule (40 mg total) by mouth daily.  100 capsule  3  . simvastatin (ZOCOR) 20 MG tablet Take 1 tablet (20 mg total) by mouth at bedtime.  100 tablet  3  . vardenafil (LEVITRA) 10 MG tablet Take 1 tablet (10 mg total) by mouth as needed.  10 tablet  6  . zolpidem (AMBIEN) 5 MG tablet One half tablet at bedtime when necessary for sleep  30 tablet  5  . hydrocortisone (ANUSOL-HC) 25 MG suppository Place 1 suppository (25 mg total) rectally 2 (two) times daily.  12 suppository  1   No facility-administered medications prior to visit.   No Known Allergies Patient Active Problem List   Diagnosis Date Noted  . Abdominal pain, chronic, right lower quadrant 10/17/2012  . Rectal fissure 10/02/2012  . Unspecified constipation 10/02/2012  . Abnormal TSH 07/10/2012  . Insomnia 07/08/2011  . GERD (gastroesophageal reflux disease) 07/08/2011  . OTHER SPECIFIED IDIOPATHIC PERIPHERAL NEUROPATHY 06/10/2009  . Diverticulosis of Colon (without Mention of Hemorrhage) 10/26/2007  . MIXED HYPERLIPIDEMIA 05/01/2007  . ERECTILE DYSFUNCTION, MILD 05/01/2007  . BACK PAIN, CHRONIC 05/01/2007  . HYPERTENSION  03/07/2007   History  Substance Use Topics  . Smoking status: Former Smoker    Quit date: 06/07/1974  . Smokeless tobacco: Never Used  . Alcohol Use: No   family history includes COPD in an unspecified family member; Diabetes in an unspecified family member; Heart disease in an unspecified family member; Hypertension in an unspecified family member; and Stroke in an unspecified family member.     Objective:   Physical Exam  Well-developed older male in no acute distress, blood pressure 110/70 pulse 60 height 5 foot 11 weight 190 very  pleasant. HEENT; nontraumatic normocephalic EOMI PERRLA sclera anicteric,Neck; Supple no JVD, Cardiovascular; regular rate and rhythm with S1-S2 no murmur or gallop, Pulmonary clear bilaterally, Abdomen ;soft bowel sounds are active there is no palpable mass or hepatosplenomegaly he is tender in the right lower quadrant there is no guarding or rebound and no obvious ventral hernia. Rectal ;exam not done, Extremities; no clubbing cyanosis or edema skin warm and dry, Psych; mood and affect normal and appropriate.       Assessment & Plan:  #43 67 year old male with 2-3 month history of change in bowel habits with new onset of constipation, right lower quadrant pain and intermittent small-volume hematochezia.  Will rule out occult colon lesion #2 diverticulosis #3 history of partial duodenal outlet obstruction 2009 secondary to NSAID-induced ulcer disease #4 GERD #5 chronic back pain with lower extremity  neuropathy  Plan; patient will increase MiraLax to 2 doses each day Schedule for colonoscopy with Dr. Hermelinda Medicus discussed in detail with the patient and he is agreeable to proceed. If colonoscopy is unrevealing as to source of his right lower quadrant pain, he will need CT scan of the abdomen and pelvis

## 2012-10-24 NOTE — Patient Instructions (Addendum)
We sent the prescription for the colonoscopy to [facility]. Take Miralax, 2 doses daily.   You have been scheduled for a colonoscopy with propofol. Please follow written instructions given to you at your visit today.  Please pick up your prep kit at the pharmacy within the next 1-3 days. If you use inhalers (even only as needed), please bring them with you on the day of your procedure. Your physician has requested that you go to www.startemmi.com and enter the access code given to you at your visit today. This web site gives a general overview about your procedure. However, you should still follow specific instructions given to you by our office regarding your preparation for the procedure.

## 2012-11-15 ENCOUNTER — Ambulatory Visit (AMBULATORY_SURGERY_CENTER): Payer: Medicare Other | Admitting: Internal Medicine

## 2012-11-15 ENCOUNTER — Encounter: Payer: Self-pay | Admitting: Internal Medicine

## 2012-11-15 VITALS — BP 140/95 | HR 46 | Temp 97.4°F | Resp 19 | Ht 71.0 in | Wt 198.0 lb

## 2012-11-15 DIAGNOSIS — G8929 Other chronic pain: Secondary | ICD-10-CM

## 2012-11-15 DIAGNOSIS — K921 Melena: Secondary | ICD-10-CM

## 2012-11-15 DIAGNOSIS — Z1211 Encounter for screening for malignant neoplasm of colon: Secondary | ICD-10-CM

## 2012-11-15 DIAGNOSIS — K5900 Constipation, unspecified: Secondary | ICD-10-CM

## 2012-11-15 DIAGNOSIS — R1031 Right lower quadrant pain: Secondary | ICD-10-CM

## 2012-11-15 MED ORDER — SODIUM CHLORIDE 0.9 % IV SOLN: 500.0000 mL | INTRAVENOUS | Status: DC

## 2012-11-15 NOTE — Progress Notes (Signed)
Patient did not experience any of the following events: a burn prior to discharge; a fall within the facility; wrong site/side/patient/procedure/implant event; or a hospital transfer or hospital admission upon discharge from the facility. (G8907) Patient did not have preoperative order for IV antibiotic SSI prophylaxis. (G8918)  

## 2012-11-15 NOTE — Op Note (Signed)
[facility] [address] [place], [address]   COLONOSCOPY PROCEDURE REPORT  PATIENT: Kurt Parsons, Kurt Parsons  MR#: #[id number] BIRTHDATE: 1946-05-05 , 67  yrs. old GENDER: Male ENDOSCOPIST: Hart Carwin, MD REFERRED BY:  Roderick Pee, M.D. PROCEDURE DATE:  11/15/2012 PROCEDURE:   Colonoscopy, screening ASA CLASS:   Class II INDICATIONS:Average risk patient for colon cancer. RLQ abd. pain, constipation, last colon 2005 MEDICATIONS: MAC sedation, administered by CRNA and propofol (Diprivan) 300mg  IV  DESCRIPTION OF PROCEDURE:   After the risks and benefits and of the procedure were explained, informed consent was obtained.  A digital rectal exam revealed no abnormalities of the rectum.    The LB [id number]  endoscope was introduced through the anus and advanced to the cecum, which was identified by both the appendix and ileocecal valve .  The quality of the prep was good, using MoviPrep .  The instrument was then slowly withdrawn as the colon was fully examined.     COLON FINDINGS: There was moderate diverticulosis noted throughout the entire examined colon with associated angulation, muscular hypertrophy and tortuosity.   Small internal hemorrhoids were found.     Retroflexed views revealed no abnormalities.     The scope was then withdrawn from the patient and the procedure completed.  COMPLICATIONS: There were no complications. ENDOSCOPIC IMPRESSION: 1.   There was moderate diverticulosis noted throughout the entire examined colon 2.   Small internal hemorrhoids  RECOMMENDATIONS: High fiber diet Miralax 9 gms 3x/week as needed, also may use Mag Oxide 500mg  tabs 1 po qd prn   REPEAT EXAM: In 10 year(s)  for Colonoscopy.  cc:  _______________________________ eSignedHart Carwin, MD 11/15/2012 2:46 PM     PATIENT NAME:  Kurt Parsons, Kurt Parsons MR#: #[id number]

## 2012-11-15 NOTE — Patient Instructions (Addendum)

## 2012-11-16 ENCOUNTER — Telehealth: Payer: Self-pay | Admitting: *Deleted

## 2012-11-16 NOTE — Telephone Encounter (Signed)
  Follow up Call-  Call back number 11/15/2012  Post procedure Call Back phone  # [phone]  Permission to leave phone message Yes     Patient questions:  Do you have a fever, pain , or abdominal swelling? no Pain Score  0 *  Have you tolerated food without any problems? yes  Have you been able to return to your normal activities? yes  Do you have any questions about your discharge instructions: Diet   no Medications  no Follow up visit  no  Do you have questions or concerns about your Care? no  Actions: * If pain score is 4 or above: No action needed, pain <4.

## 2012-12-04 ENCOUNTER — Telehealth: Payer: Self-pay | Admitting: Internal Medicine

## 2012-12-04 DIAGNOSIS — R1031 Right lower quadrant pain: Secondary | ICD-10-CM

## 2012-12-04 NOTE — Telephone Encounter (Signed)
Spoke with patient and he had the colonoscopy that Amy Esterwood, PA recommended d/t RLQ pain. He states the pain is still there. He states when he coughs the pain is bad. Please, advise.

## 2012-12-06 ENCOUNTER — Other Ambulatory Visit (INDEPENDENT_AMBULATORY_CARE_PROVIDER_SITE_OTHER): Payer: Medicare Other

## 2012-12-06 DIAGNOSIS — R1031 Right lower quadrant pain: Secondary | ICD-10-CM

## 2012-12-06 LAB — BUN: BUN: 16 mg/dL (ref 6–23)

## 2012-12-06 NOTE — Telephone Encounter (Signed)
Pt needs a CT abd/pelvis with contrast   For further evaluation of his RLQ  Pain- please schedule.

## 2012-12-06 NOTE — Telephone Encounter (Signed)
Scheduled CT at [facility] CT on 12/07/12 at 2:30 PM([place]) NPO 4 hours prior. Contrast 2 and 1 hour prior.  Needs BUN, Creatinine drawn prior. Patient notified of appointment date, time and instructions. Contrast up front for pick up. Patient will call [place] [facility] to change the date .

## 2012-12-07 ENCOUNTER — Other Ambulatory Visit: Payer: Medicare Other

## 2012-12-14 ENCOUNTER — Ambulatory Visit (INDEPENDENT_AMBULATORY_CARE_PROVIDER_SITE_OTHER)
Admission: RE | Admit: 2012-12-14 | Discharge: 2012-12-14 | Disposition: A | Discharge status: Home / Self Care | Payer: Medicare Other | Source: Ambulatory Visit | Attending: Physician Assistant | Admitting: Physician Assistant

## 2012-12-14 DIAGNOSIS — R1031 Right lower quadrant pain: Secondary | ICD-10-CM

## 2012-12-14 MED ORDER — IOHEXOL 300 MG/ML  SOLN
100.0000 mL | Freq: Once | INTRAMUSCULAR | Status: AC | PRN
  Administered 2012-12-14: 100 mL via INTRAVENOUS

## 2012-12-15 ENCOUNTER — Ambulatory Visit (INDEPENDENT_AMBULATORY_CARE_PROVIDER_SITE_OTHER): Payer: Medicare Other | Admitting: Family Medicine

## 2012-12-15 VITALS — BP 120/82 | Temp 98.2°F | Wt 201.0 lb

## 2012-12-15 DIAGNOSIS — IMO0002 Reserved for concepts with insufficient information to code with codable children: Secondary | ICD-10-CM

## 2012-12-15 DIAGNOSIS — T148XXA Other injury of unspecified body region, initial encounter: Secondary | ICD-10-CM

## 2012-12-15 MED ORDER — MUPIROCIN 2 % EX OINT: TOPICAL_OINTMENT | Freq: Two times a day (BID) | CUTANEOUS | Status: DC

## 2012-12-15 NOTE — Patient Instructions (Signed)
-  gently wash with mild soap and water daily  -keep clean and dry  -apply antibiotic ointment daily  -follow up next week

## 2012-12-15 NOTE — Progress Notes (Signed)
Chief Complaint  Patient presents with  . Fall    on Monday     HPI:  Acute visit for bruises on leg: -s/p mechanical fall 4 days ago - while out doing yard work and tripped over branches and hit and scraped leg on on tree -daughter whom is a doctor washed wound and put neosporin on wound -has extensive scrapes on both shins -able to bear weight -denies: fevers, chills, signs of infection, welling, drainage  ROS: See pertinent positives and negatives per HPI.  Past Medical History  Diagnosis Date  . Lazy eye rt eye  . Hyperlipidemia   . ED (erectile dysfunction)   . Sleep disturbance   . Hypertension   . Chronic pain syndrome   . Diverticulosis   . Ulcer     Family History  Problem Relation Age of Onset  . Diabetes    . Hypertension    . Stroke    . Heart disease    . COPD      History   Social History  . Marital Status: Married    Spouse Name: N/A    Number of Children: 1  . Years of Education: N/A   Occupational History  . retired    Social History Main Topics  . Smoking status: Former Smoker    Quit date: 06/07/1974  . Smokeless tobacco: Never Used  . Alcohol Use: No  . Drug Use: No  . Sexually Active: Not on file   Other Topics Concern  . Not on file   Social History Narrative  . No narrative on file    Current outpatient prescriptions:aspirin EC 81 MG tablet, Take 1 tablet (81 mg total) by mouth daily., Disp: , Rfl: ;  citalopram (CELEXA) 20 MG tablet, Take 1 tablet (20 mg total) by mouth daily., Disp: 100 tablet, Rfl: 3;  Docusate Sodium (COLACE PO), Take by mouth., Disp: , Rfl: ;  gabapentin (NEURONTIN) 800 MG tablet, Take 1 tablet (800 mg total) by mouth 2 (two) times daily., Disp: 200 tablet, Rfl: 3 lisinopril (PRINIVIL,ZESTRIL) 10 MG tablet, Take 1 tablet (10 mg total) by mouth daily., Disp: 90 tablet, Rfl: 3;  omeprazole (PRILOSEC) 40 MG capsule, Take 1 capsule (40 mg total) by mouth daily., Disp: 100 capsule, Rfl: 3;  simvastatin (ZOCOR) 20  MG tablet, Take 1 tablet (20 mg total) by mouth at bedtime., Disp: 100 tablet, Rfl: 3;  vardenafil (LEVITRA) 10 MG tablet, Take 1 tablet (10 mg total) by mouth as needed., Disp: 10 tablet, Rfl: 6 zolpidem (AMBIEN) 5 MG tablet, One half tablet at bedtime when necessary for sleep, Disp: 30 tablet, Rfl: 5;  mupirocin ointment (BACTROBAN) 2 %, Apply topically 2 (two) times daily., Disp: 22 g, Rfl: 0  EXAM:  Filed Vitals:   12/15/12 0858  BP: 120/82  Temp: 98.2 F (36.8 C)    Body mass index is 28.05 kg/(m^2).  GENERAL: vitals reviewed and listed above, alert, oriented, appears well hydrated and in no acute distress  HEENT: atraumatic, conjunttiva clear, no obvious abnormalities on inspection of external nose and ears  NECK: no obvious masses on inspection  LUNGS: clear to auscultation bilaterally, no wheezes, rales or rhonchi, good air movement  SKIN: superficial abrasions bilat shins - clean and healing well  PSYCH: pleasant and cooperative, no obvious depression or anxiety  ASSESSMENT AND PLAN:  Discussed the following assessment and plan:  Abrasion of skin - Plan: mupirocin ointment (BACTROBAN) 2 %  -superficial abrasions that are healing well  with no signs of infection -wound care recs and follow up next week -Patient advised to return or notify a doctor immediately if symptoms worsen or persist or new concerns arise.  Patient Instructions  -gently wash with mild soap and water daily  -keep clean and dry  -apply antibiotic ointment daily  -follow up next week     Kurt Parsons R.

## 2012-12-21 ENCOUNTER — Ambulatory Visit: Payer: Medicare Other | Admitting: Family Medicine

## 2012-12-26 ENCOUNTER — Telehealth: Payer: Self-pay | Admitting: Internal Medicine

## 2012-12-26 NOTE — Telephone Encounter (Signed)
Left a message for patient to call me. 

## 2012-12-26 NOTE — Telephone Encounter (Signed)
Spoke with patient and gave him Amy Esterwood, PA recommendation. 

## 2012-12-26 NOTE — Telephone Encounter (Signed)
Patient asking for CT results. Please, advise. 

## 2012-12-26 NOTE — Telephone Encounter (Signed)
Spoke with patient and gave him results. He is still having RLQ pain and chronic constipation. Scheduled with Dr. Juanda Chance on 01/05/13 at 2:30 PM.

## 2012-12-26 NOTE — Telephone Encounter (Signed)
CT scan shows tiny hepatic cysts, sludge in gallbladder, a small right inguinal hernia containing fat, and a right hydrocele(-Cyst in testicle).  I have not seen him since May- see if he is still hurting and where- he may need to be re-examined to determine cause for pain...none of the CT findings necessarily explain  pain

## 2012-12-26 NOTE — Telephone Encounter (Signed)
Ok thanks-   May ask him to start Miralax 17 gm daily between now and follow up with Dr. Juanda Chance

## 2012-12-27 ENCOUNTER — Encounter: Payer: Self-pay | Admitting: *Deleted

## 2013-01-01 ENCOUNTER — Ambulatory Visit: Payer: Medicare Other | Admitting: Family Medicine

## 2013-01-05 ENCOUNTER — Encounter: Payer: Self-pay | Admitting: Internal Medicine

## 2013-01-05 ENCOUNTER — Ambulatory Visit (INDEPENDENT_AMBULATORY_CARE_PROVIDER_SITE_OTHER): Payer: Medicare Other | Admitting: Internal Medicine

## 2013-01-05 VITALS — BP 110/70 | HR 68 | Ht 70.5 in | Wt 200.2 lb

## 2013-01-05 DIAGNOSIS — K4090 Unilateral inguinal hernia, without obstruction or gangrene, not specified as recurrent: Secondary | ICD-10-CM

## 2013-01-05 DIAGNOSIS — R1031 Right lower quadrant pain: Secondary | ICD-10-CM

## 2013-01-05 NOTE — Progress Notes (Signed)
Kurt Parsons [birth year] MRN [id number]  History of Present Illness:  This is a 67 year old white male with intermittent right lower quadrant abdominal pain of several months duration. It has become worse especially when he lifts heavy weights or if he stands up. It goes away when he lays down. It does not correlate with his bowel habits or bleeding. He used to lift weights. He pressed 400 lbs at one point. He now has a right inguinal hernia which showed on a recent CT scan of the abdomen and pelvis. We saw him in the past for duodenal outlet obstruction due to peptic ulcer disease in June 2009. He just had a recent colonoscopy in July 2014 which showed moderate to severe diverticulosis and constipation. He was given MiraLax and magnesium oxide. A CT scan of the abdomen showed a right hydrocele and right inguinal hernia with fat. Sludge in the gallbladder and small renal and liver cysts.   Past Medical History  Diagnosis Date  . Lazy eye rt eye  . Hyperlipidemia   . ED (erectile dysfunction)   . Sleep disturbance   . Hypertension   . Chronic pain syndrome   . Diverticulosis   . Ulcer   . Duodenal obstruction     partial duodenal outlet  . Neuropathy   . GERD (gastroesophageal reflux disease)   . Chronic back pain   . Inguinal hernia   . Liver cyst   . Right hydrocele   . Gallbladder sludge   . Internal hemorrhoids    Past Surgical History  Procedure Laterality Date  . Cervical disc surgery      x 2  . Lumbar disc surgery      x 2  . Colonoscopy    . Neck surgery    . Back surgery    . Hemorrhoid surgery      reports that he quit smoking about 38 years ago. He has never used smokeless tobacco. He reports that he does not drink alcohol or use illicit drugs. family history includes COPD in an unspecified family member; Diabetes in an unspecified family member; Heart disease in an unspecified family member; Hypertension in an unspecified family member; and Stroke in an unspecified  family member. No Known Allergies      Review of Systems: Denies nausea vomiting  The remainder of the 10 point ROS is negative except as outlined in H&P   Physical Exam: General appearance  Well developed, in no distress. Eyes- non icteric. HEENT nontraumatic, normocephalic. Mouth no lesions, tongue papillated, no cheilosis. Neck supple without adenopathy, thyroid not enlarged, no carotid bruits, no JVD. Lungs Clear to auscultation bilaterally. Cor normal S1, normal S2, regular rhythm, no murmur,  quiet precordium. Abdomen: Tender with normoactive bowel sounds. No visible hernia. When he stands up, the exam of the scrotum to check for inguinal hernia detects tenderness in the right lower quadrant and when he coughs there is some pain but I don't feel any herniation along the scrotum. Rectal: Not done. Extremities no pedal edema. Skin no lesions. Neurological alert and oriented x 3. Psychological normal mood and affect.  Assessment and Plan:  Problem #2 67 year old white male with a small right inguinal hernia which contains fat. It is asymptomatic. The hernia was not present on his old CT scan from 2009. I have assured him that observation may be appropriate at this time since the hernia is very small. There is no evidence of obstruction. He will avoid heavy lifting. I offered a surgical  consultation but he prefers to wait.   01/05/2013 [name]

## 2013-01-05 NOTE — Patient Instructions (Addendum)
CC: Dr Todd 

## 2013-01-31 ENCOUNTER — Other Ambulatory Visit: Payer: Self-pay | Admitting: Family Medicine

## 2013-02-10 ENCOUNTER — Other Ambulatory Visit: Payer: Self-pay | Admitting: Family Medicine

## 2013-02-16 ENCOUNTER — Ambulatory Visit (INDEPENDENT_AMBULATORY_CARE_PROVIDER_SITE_OTHER): Payer: Medicare Other | Admitting: Family Medicine

## 2013-02-16 ENCOUNTER — Telehealth: Payer: Self-pay | Admitting: Internal Medicine

## 2013-02-16 ENCOUNTER — Encounter: Payer: Self-pay | Admitting: Family Medicine

## 2013-02-16 VITALS — BP 134/86 | HR 65 | Temp 98.4°F | Wt 201.0 lb

## 2013-02-16 DIAGNOSIS — K4090 Unilateral inguinal hernia, without obstruction or gangrene, not specified as recurrent: Secondary | ICD-10-CM

## 2013-02-16 NOTE — Telephone Encounter (Signed)
He has a right inguinal hernia on last exam and CT scan 12/2012- the swelling may be prolapsed hernia. He needs to assessed in ED if it is acute, otherwise general surgical consultation. I will be happy to see him for any other type of pain.( he had ulcers in the past).

## 2013-02-16 NOTE — Telephone Encounter (Signed)
Patient is calling because he is having abdominal pain and a new problem. He states "one of my balls is smaller than the other. One is large and filled with fluid." Reports the swelling started 3 weeks ago. States he did not think he should wait until his October OV for this. He is calling his PCP about this also. He was told by a friend he should see a urologist. Please, advise.

## 2013-02-16 NOTE — Patient Instructions (Signed)

## 2013-02-16 NOTE — Progress Notes (Signed)
Chief Complaint  Patient presents with  . Abdominal Pain    Pain in RLQ and now with a swollen groin    HPI:  [name] is a 67 yo male pt of Dr. Tawanna Cooler here for an acute visit for Chronic RLQ abd pain: -per [facility] extensive workup with CT scan and saw GI -has known inguinal hernia - but refused surgical consult in past -now has worsened soreness of R groin region on and off for last 3 weeks with some intermittent swelling/bulging in R groin/testicle -called his GI doc whom told him to see pcp as likely his hernia -denies fevers, chills, urinary symptoms, NVD, severe pain  ROS: See pertinent positives and negatives per HPI.  Past Medical History  Diagnosis Date  . Lazy eye rt eye  . Hyperlipidemia   . ED (erectile dysfunction)   . Sleep disturbance   . Hypertension   . Chronic pain syndrome   . Diverticulosis   . Ulcer   . Duodenal obstruction     partial duodenal outlet  . Neuropathy   . GERD (gastroesophageal reflux disease)   . Chronic back pain   . Inguinal hernia   . Liver cyst   . Right hydrocele   . Gallbladder sludge   . Internal hemorrhoids     Past Surgical History  Procedure Laterality Date  . Cervical disc surgery      x 2  . Lumbar disc surgery      x 2  . Colonoscopy    . Neck surgery    . Back surgery    . Hemorrhoid surgery      Family History  Problem Relation Age of Onset  . Diabetes    . Hypertension    . Stroke    . Heart disease    . COPD      History   Social History  . Marital Status: Married    Spouse Name: N/A    Number of Children: 1  . Years of Education: N/A   Occupational History  . retired    Social History Main Topics  . Smoking status: Former Smoker    Quit date: 06/07/1974  . Smokeless tobacco: Never Used  . Alcohol Use: No  . Drug Use: No  . Sexual Activity: None   Other Topics Concern  . None   Social History Narrative  . None    Current outpatient prescriptions:aspirin EC 81 MG tablet, Take 1  tablet (81 mg total) by mouth daily., Disp: , Rfl: ;  citalopram (CELEXA) 20 MG tablet, Take 1 tablet (20 mg total) by mouth daily., Disp: 100 tablet, Rfl: 3;  Docusate Sodium (COLACE PO), Take by mouth., Disp: , Rfl: ;  gabapentin (NEURONTIN) 800 MG tablet, Take 1 tablet (800 mg total) by mouth 2 (two) times daily., Disp: 200 tablet, Rfl: 3 HYDROcodone-homatropine (HYCODAN) 5-1.5 MG/5ML syrup, take 1/2 teaspoonful by mouth three times a day if needed, Disp: 120 mL, Rfl: 1;  lisinopril (PRINIVIL,ZESTRIL) 10 MG tablet, Take 1 tablet (10 mg total) by mouth daily., Disp: 90 tablet, Rfl: 3;  mupirocin ointment (BACTROBAN) 2 %, Apply topically 2 (two) times daily., Disp: 22 g, Rfl: 0 omeprazole (PRILOSEC) 40 MG capsule, Take 1 capsule (40 mg total) by mouth daily., Disp: 100 capsule, Rfl: 3;  simvastatin (ZOCOR) 20 MG tablet, Take 1 tablet (20 mg total) by mouth at bedtime., Disp: 100 tablet, Rfl: 3;  vardenafil (LEVITRA) 10 MG tablet, Take 1 tablet (10 mg  total) by mouth as needed., Disp: 10 tablet, Rfl: 6;  zolpidem (AMBIEN) 5 MG tablet, take 1/2 tablet by mouth at bedtime if needed for sleep, Disp: 30 tablet, Rfl: 5  EXAM:  Filed Vitals:   02/16/13 1536  BP: 134/86  Pulse: 65  Temp: 98.4 F (36.9 C)    Body mass index is 28.42 kg/(m^2).  GENERAL: vitals reviewed and listed above, alert, oriented, appears well hydrated and in no acute distress  HEENT: atraumatic, conjunttiva clear, no obvious abnormalities on inspection of external nose and ears  NECK: no obvious masses on inspection  ABD: soft, NTTP  GU: R inguinal hernia, reducible, no testicular swelling, erythema or TTP  MS: moves all extremities without noticeable abnormality  PSYCH: pleasant and cooperative, no obvious depression or anxiety  ASSESSMENT AND PLAN:  Discussed the following assessment and plan:  Right inguinal hernia - Plan: Ambulatory referral to General Surgery  -discussed options and advised surgical referral -  placed -advised is severe pain, non reducible, fevers, redness, etc to see a doctor immediately, ED precuations -Patient advised to return or notify a doctor immediately if symptoms worsen or persist or new concerns arise.  Patient Instructions  Inguinal Hernia, Adult Muscles help keep everything in the body in its proper place. But if a weak spot in the muscles develops, something can poke through. That is called a hernia. When this happens in the lower part of the belly (abdomen), it is called an inguinal hernia. (It takes its name from a part of the body in this region called the inguinal canal.) A weak spot in the wall of muscles lets some fat or part of the small intestine bulge through. An inguinal hernia can develop at any age. Men get them more often than women. CAUSES  In adults, an inguinal hernia develops over time.  It can be triggered by:  Suddenly straining the muscles of the lower abdomen.  Lifting heavy objects.  Straining to have a bowel movement. Difficult bowel movements (constipation) can lead to this.  Constant coughing. This may be caused by smoking or lung disease.  Being overweight.  Being pregnant.  Working at a job that requires long periods of standing or heavy lifting.  Having had an inguinal hernia before. One type can be an emergency situation. It is called a strangulated inguinal hernia. It develops if part of the small intestine slips through the weak spot and cannot get back into the abdomen. The blood supply can be cut off. If that happens, part of the intestine may die. This situation requires emergency surgery. SYMPTOMS  Often, a small inguinal hernia has no symptoms. It is found when a healthcare provider does a physical exam. Larger hernias usually have symptoms.   In adults, symptoms may include:  A lump in the groin. This is easier to see when the person is standing. It might disappear when lying down.  In men, a lump in the scrotum.  Pain  or burning in the groin. This occurs especially when lifting, straining or coughing.  A dull ache or feeling of pressure in the groin.  Signs of a strangulated hernia can include:  A bulge in the groin that becomes very painful and tender to the touch.  A bulge that turns red or purple.  Fever, nausea and vomiting.  Inability to have a bowel movement or to pass gas. DIAGNOSIS  To decide if you have an inguinal hernia, a healthcare provider will probably do a physical examination.  This will include asking questions about any symptoms you have noticed.  The healthcare provider might feel the groin area and ask you to cough. If an inguinal hernia is felt, the healthcare provider may try to slide it back into the abdomen.  Usually no other tests are needed. TREATMENT  Treatments can vary. The size of the hernia makes a difference. Options include:  Watchful waiting. This is often suggested if the hernia is small and you have had no symptoms.  No medical procedure will be done unless symptoms develop.  You will need to watch closely for symptoms. If any occur, contact your healthcare provider right away.  Surgery. This is used if the hernia is larger or you have symptoms.  Open surgery. This is usually an outpatient procedure (you will not stay overnight in a hospital). An cut (incision) is made through the skin in the groin. The hernia is put back inside the abdomen. The weak area in the muscles is then repaired by herniorrhaphy or hernioplasty. Herniorrhaphy: in this type of surgery, the weak muscles are sewn back together. Hernioplasty: a patch or mesh is used to close the weak area in the abdominal wall.  Laparoscopy. In this procedure, a surgeon makes small incisions. A thin tube with a tiny video camera (called a laparoscope) is put into the abdomen. The surgeon repairs the hernia with mesh by looking with the video camera and using two long instruments. HOME CARE INSTRUCTIONS     After surgery to repair an inguinal hernia:  You will need to take pain medicine prescribed by your healthcare provider. Follow all directions carefully.  You will need to take care of the wound from the incision.  Your activity will be restricted for awhile. This will probably include no heavy lifting for several weeks. You also should not do anything too active for a few weeks. When you can return to work will depend on the type of job that you have.  During "watchful waiting" periods, you should:  Maintain a healthy weight.  Eat a diet high in fiber (fruits, vegetables and whole grains).  Drink plenty of fluids to avoid constipation. This means drinking enough water and other liquids to keep your urine clear or pale yellow.  Do not lift heavy objects.  Do not stand for long periods of time.  Quit smoking. This should keep you from developing a frequent cough. SEEK MEDICAL CARE IF:   A bulge develops in your groin area.  You feel pain, a burning sensation or pressure in the groin. This might be worse if you are lifting or straining.  You develop a fever of more than 100.5 F (38.1 C). SEEK IMMEDIATE MEDICAL CARE IF:   Pain in the groin increases suddenly.  A bulge in the groin gets bigger suddenly and does not go down.  For men, there is sudden pain in the scrotum. Or, the size of the scrotum increases.  A bulge in the groin area becomes red or purple and is painful to touch.  You have nausea or vomiting that does not go away.  You feel your heart beating much faster than normal.  You cannot have a bowel movement or pass gas.  You develop a fever of more than 102.0 F (38.9 C). Document Released: 10/10/2008 Document Revised: 08/16/2011 Document Reviewed: 10/10/2008 [facility] Patient Information 2014 [place], Lona Kettle, Dahlia Client R.

## 2013-02-19 NOTE — Telephone Encounter (Signed)
Left a message for patient to call me. 

## 2013-02-19 NOTE — Telephone Encounter (Signed)
Spoke with patient and gave him Dr. Regino Schultze recommendation. He is scheduled to see a suregon.

## 2013-02-23 ENCOUNTER — Encounter (INDEPENDENT_AMBULATORY_CARE_PROVIDER_SITE_OTHER): Payer: Self-pay | Admitting: Surgery

## 2013-02-23 ENCOUNTER — Ambulatory Visit (INDEPENDENT_AMBULATORY_CARE_PROVIDER_SITE_OTHER): Payer: Medicare Other | Admitting: Surgery

## 2013-02-23 VITALS — BP 118/72 | HR 66 | Temp 97.6°F | Resp 14 | Ht 71.0 in | Wt 201.2 lb

## 2013-02-23 DIAGNOSIS — K4090 Unilateral inguinal hernia, without obstruction or gangrene, not specified as recurrent: Secondary | ICD-10-CM

## 2013-02-23 NOTE — Progress Notes (Signed)
Patient ID: Kurt Parsons, male   DOB: [birth year], 67 y.o.   MRN: [id number]  Chief Complaint  Patient presents with  . New Evaluation    eval RIH    HPI Kurt Parsons is a 67 y.o. male.  Referred by Dr. Kriste Basque for evaluation of right inguinal hernia HPI This is a 67 year old male who presents with a six-month history of pain in his right lower quadrant down into his groin. He was evaluated by Dr. [name] for this pain. She performed a colonoscopy which was reportedly normal.  A CT scan showed no significant findings other than a right inguinal hernia containing fat. This area has enlarged and now has swelling radiating down to his testicle.He presents now for surgical evaluation.  Past Medical History  Diagnosis Date  . Lazy eye rt eye  . Hyperlipidemia   . ED (erectile dysfunction)   . Sleep disturbance   . Hypertension   . Chronic pain syndrome   . Diverticulosis   . Ulcer   . Duodenal obstruction     partial duodenal outlet  . Neuropathy   . GERD (gastroesophageal reflux disease)   . Chronic back pain   . Inguinal hernia   . Liver cyst   . Right hydrocele   . Gallbladder sludge   . Internal hemorrhoids     Past Surgical History  Procedure Laterality Date  . Cervical disc surgery      x 2  . Lumbar disc surgery      x 2  . Colonoscopy    . Neck surgery    . Back surgery    . Hemorrhoid surgery      Family History  Problem Relation Age of Onset  . Diabetes    . Hypertension    . Stroke    . Heart disease    . COPD      Social History History  Substance Use Topics  . Smoking status: Former Smoker    Quit date: 06/07/1974  . Smokeless tobacco: Never Used  . Alcohol Use: No    No Known Allergies  Current Outpatient Prescriptions  Medication Sig Dispense Refill  . aspirin EC 81 MG tablet Take 1 tablet (81 mg total) by mouth daily.      . citalopram (CELEXA) 20 MG tablet Take 1 tablet (20 mg total) by mouth daily.  100 tablet  3  . gabapentin  (NEURONTIN) 800 MG tablet Take 1 tablet (800 mg total) by mouth 2 (two) times daily.  200 tablet  3  . lisinopril (PRINIVIL,ZESTRIL) 10 MG tablet Take 1 tablet (10 mg total) by mouth daily.  90 tablet  3  . morphine (MSIR) 15 MG tablet       . omeprazole (PRILOSEC) 40 MG capsule Take 1 capsule (40 mg total) by mouth daily.  100 capsule  3  . simvastatin (ZOCOR) 20 MG tablet Take 1 tablet (20 mg total) by mouth at bedtime.  100 tablet  3  . zolpidem (AMBIEN) 5 MG tablet take 1/2 tablet by mouth at bedtime if needed for sleep  30 tablet  5  . Docusate Sodium (COLACE PO) Take by mouth.      Marland Kitchen HYDROcodone-homatropine (HYCODAN) 5-1.5 MG/5ML syrup take 1/2 teaspoonful by mouth three times a day if needed  120 mL  1   No current facility-administered medications for this visit.    Review of Systems Review of Systems  Constitutional: Negative for fever, chills and unexpected weight  change.  HENT: Negative for hearing loss, congestion, sore throat, trouble swallowing and voice change.   Eyes: Negative for visual disturbance.  Respiratory: Negative for cough and wheezing.   Cardiovascular: Negative for chest pain, palpitations and leg swelling.  Gastrointestinal: Positive for constipation. Negative for nausea, vomiting, abdominal pain, diarrhea, blood in stool, abdominal distention, anal bleeding and rectal pain.  Genitourinary: Positive for scrotal swelling and testicular pain. Negative for hematuria and difficulty urinating.  Musculoskeletal: Negative for arthralgias.  Skin: Negative for rash and wound.  Neurological: Negative for seizures, syncope, weakness and headaches.  Hematological: Negative for adenopathy. Does not bruise/bleed easily.  Psychiatric/Behavioral: Negative for confusion.    Blood pressure 118/72, pulse 66, temperature 97.6 F (36.4 C), temperature source Temporal, resp. rate 14, height 5\' 11"  (1.803 m), weight 201 lb 3.2 oz (91.264 kg).  Physical Exam Physical Exam WDWN  in NAD HEENT:  EOMI, sclera anicteric Neck:  No masses, no thyromegaly Lungs:  CTA bilaterally; normal respiratory effort CV:  Regular rate and rhythm; no murmurs Abd:  +bowel sounds, soft, non-tender, no masses GU:  Bilateral descended testes; no testicular masses; moderate right inguinal hernia - reducible; no sign of left inguinal hernia Ext:  Well-perfused; no edema Skin:  Warm, dry; no sign of jaundice  Data Reviewed RADIOLOGY REPORT*  Clinical Data: Right lower quadrant pain  CT ABDOMEN AND PELVIS WITH CONTRAST  Technique: Multidetector CT imaging of the abdomen and pelvis was  performed following the standard protocol during bolus  administration of intravenous contrast.  Contrast: OMNIPAQUE IOHEXOL 300 MG/ML SOLN  Comparison: 10/25/2007  Findings: The lung bases are clear. No pleural or pericardial  effusion identified.  Fluid attenuating structure in the left hepatic lobe is stable  measuring 10 mm. There is a fluid attenuating structure in the  periphery of the right hepatic lobe which is stable measuring 1.3  cm. Sludge and/or stones are noted within the gallbladder. No  gallbladder wall thickening or pericholecystic fluid. The pancreas  is normal. Normal appearance of the spleen.  The adrenal glands are both normal. There is a cyst within the  inferior pole of the right kidney which measures 1.2 cm, image  34/series 2. The left kidney appears normal. The urinary bladder  is unremarkable. Prostate gland appears enlarged and has mass  effect upon the bladder base. A right-sided hydrocele is noted.  Normal caliber of the abdominal aorta. There is calcified  atherosclerotic disease. No upper abdominal adenopathy identified.  There is no pelvic or inguinal adenopathy noted. No free fluid or  fluid collections identified within the abdomen or the pelvis.  The stomach appears normal. The small bowel loops are  unremarkable. Normal appearance of the proximal colon.  There is  multiple distal colonic diverticula without acute inflammation.  The appendix is visualized and appears within normal limits.  Right inguinal hernia is present which contains fat.  Review of the visualized bony structures is significant for mild  scoliosis which is convex towards the left. Degenerative disc  disease is identified at the L 34.  IMPRESSION:  1. No acute findings within the abdomen or pelvis.  2. The appendix is visualized and appears normal.  3. Right inguinal hernia containing fat.  4. Right hydrocele.  5. Liver cysts.  6. Sludge and/or stones within the gallbladder.  Original Report Authenticated By: Signa Kell, M.D.   Assessment    Reducible right inguinal hernia     Plan    Right inguinal hernia repair with  mesh.  The surgical procedure has been discussed with the patient.  Potential risks, benefits, alternative treatments, and expected outcomes have been explained.  All of the patient's questions at this time have been answered.  The likelihood of reaching the patient's treatment goal is good.  The patient understand the proposed surgical procedure and wishes to proceed.         Nayab Aten K. 02/23/2013, 11:17 AM

## 2013-03-09 ENCOUNTER — Ambulatory Visit: Payer: Medicare Other | Admitting: Internal Medicine

## 2013-03-12 NOTE — Pre-Procedure Instructions (Signed)
Kurt Parsons  03/12/2013   Your procedure is scheduled on:  Tues, Oct 21 @ 11:00 AM  Report to [facility] Short Stay Entrance A @ 9:00 AM.  Call this number if you have problems the morning of surgery: [phone]   Remember:   Do not eat food or drink liquids after midnight.   Take these medicines the morning of surgery with A SIP OF WATER: Celexa(Citalopram),Gabapentin(Neurontin),Pain Pill(if needed),and Omprazole(Prilosec)               7 Days prior to surgery Stop taking your Aspirin.No Goody's,BC's,Aleve,Ibuprofen,Fish Oil,or any Herbal Medications   Do not wear jewelry  Do not wear lotions, powders, or colognes. You may wear deodorant.  Men may shave face and neck.  Do not bring valuables to the hospital.  [facility] is not responsible                  for any belongings or valuables.               Contacts, dentures or bridgework may not be worn into surgery.  Leave suitcase in the car. After surgery it may be brought to your room.  For patients admitted to the hospital, discharge time is determined by your                treatment team.               Patients discharged the day of surgery will not be allowed to drive  home.    Special Instructions: Shower using CHG 2 nights before surgery and the night before surgery.  If you shower the day of surgery use CHG.  Use special wash - you have one bottle of CHG for all showers.  You should use approximately 1/3 of the bottle for each shower.   Please read over the following fact sheets that you were given: Pain Booklet, Coughing and Deep Breathing and Surgical Site Infection Prevention

## 2013-03-13 ENCOUNTER — Ambulatory Visit (HOSPITAL_COMMUNITY)
Admission: RE | Admit: 2013-03-13 | Discharge: 2013-03-13 | Disposition: A | Discharge status: Home / Self Care | Payer: Medicare Other | Source: Ambulatory Visit | Attending: Anesthesiology | Admitting: Anesthesiology

## 2013-03-13 ENCOUNTER — Encounter (HOSPITAL_COMMUNITY): Payer: Self-pay

## 2013-03-13 ENCOUNTER — Encounter (HOSPITAL_COMMUNITY)
Admission: RE | Admit: 2013-03-13 | Discharge: 2013-03-13 | Disposition: A | Discharge status: Home / Self Care | Payer: Medicare Other | Source: Ambulatory Visit | Attending: Surgery | Admitting: Surgery

## 2013-03-13 DIAGNOSIS — Z01812 Encounter for preprocedural laboratory examination: Secondary | ICD-10-CM | POA: Insufficient documentation

## 2013-03-13 DIAGNOSIS — Z01818 Encounter for other preprocedural examination: Secondary | ICD-10-CM | POA: Insufficient documentation

## 2013-03-13 DIAGNOSIS — I10 Essential (primary) hypertension: Secondary | ICD-10-CM | POA: Insufficient documentation

## 2013-03-13 DIAGNOSIS — K4090 Unilateral inguinal hernia, without obstruction or gangrene, not specified as recurrent: Secondary | ICD-10-CM | POA: Insufficient documentation

## 2013-03-13 HISTORY — DX: Constipation, unspecified: K59.00

## 2013-03-13 HISTORY — DX: Dorsalgia, unspecified: M54.9

## 2013-03-13 HISTORY — DX: Unspecified glaucoma: H40.9

## 2013-03-13 HISTORY — DX: Cervicalgia: M54.2

## 2013-03-13 HISTORY — DX: Dizziness and giddiness: R42

## 2013-03-13 LAB — CBC
HCT: 42.8 % (ref 39.0–52.0)
Hemoglobin: 14.7 g/dL (ref 13.0–17.0)
MCHC: 34.3 g/dL (ref 30.0–36.0)
MCV: 90.7 fL (ref 78.0–100.0)
Platelets: 132 10*3/uL — ABNORMAL LOW (ref 150–400)
RBC: 4.72 MIL/uL (ref 4.22–5.81)
RDW: 13.2 % (ref 11.5–15.5)
WBC: 7.8 10*3/uL (ref 4.0–10.5)

## 2013-03-13 LAB — BASIC METABOLIC PANEL
BUN: 15 mg/dL (ref 6–23)
Calcium: 8.9 mg/dL (ref 8.4–10.5)
Chloride: 104 mEq/L (ref 96–112)
Creatinine, Ser: 1.19 mg/dL (ref 0.50–1.35)
GFR calc non Af Amer: 61 mL/min — ABNORMAL LOW (ref >90)
Sodium: 140 mEq/L (ref 135–145)

## 2013-03-13 MED ORDER — CHLORHEXIDINE GLUCONATE 4 % EX LIQD: 1.0000 "application " | Freq: Once | CUTANEOUS | Status: DC

## 2013-03-13 NOTE — Progress Notes (Addendum)
Pt saw a cardiologist bc he passed out in 2012-no further follow up was required  Echo and stress test reports in epic from 2012  Denies ever having a heart cath   Medical Md is Dr.Jeffrey Todd  EKG in epic from 07-10-12  Denies CXR in last yr

## 2013-03-13 NOTE — Progress Notes (Signed)
Anesthesia Chart Review:  Patient is a 67 year old male scheduled for right inguinal hernia repair on 03/21/13 by Dr. Corliss Skains.  History includes former smoker, HTN, glaucoma, ED, GERD, right hydrocele, chronic pain syndrome, right eye amblyopia, neuropathy, sleep disturbance (takes Ambien), prior cervical and lumbar surgeries.  PCP is listed as Dr. Kelle Darting.  He was referred to cardiologist Dr. Shirlee Latch in 2012 for syncope and chest pain.  Stress test and event monitor were uneventful.  Echo showed diastolic dysfunction but normal EF and no significant valvular abnormalities.  Syncope was ultimately felt to be likely orthostatic related to dehydration and antihypertensive medications (which have since been adjusted).  EKG on 07/10/12 (PCP) showed SB @ 56 bpm, first degree AVB, right BBB and right axis, possible RVH, consider pulmonary disease. I think his EKG is stable since at least 06/17/08.  He had a normal nuclear stress, EF 63% on 01/14/11. Event monitor in 01/2011 showed no significant arrhythmias.  Echo on 12/22/10 showed normal LV systolic function, mild LVH, EF 60-65%, normal LV wall motion, features consistent with grade 2 diastolic dysfunction, mild TR.  Preoperative CXR and labs noted.  His EKG is stable.  He had a normal cardiac work-up just over two years ago.  If no acute changes then I would anticipate that he could proceed as planned.  Velna Ochs [facility] Short Stay Center/Anesthesiology Phone [phone] 03/13/2013 5:47 PM

## 2013-03-13 NOTE — Progress Notes (Signed)
03/13/13 1011  OBSTRUCTIVE SLEEP APNEA  Have you ever been diagnosed with sleep apnea through a sleep study? No  Do you snore loudly (loud enough to be heard through closed doors)?  1  Do you often feel tired, fatigued, or sleepy during the daytime? 0  Has anyone observed you stop breathing during your sleep? 0  Do you have, or are you being treated for high blood pressure? 1  BMI more than 35 kg/m2? 0  Age over 69 years old? 1  Neck circumference greater than 40 cm/18 inches? 0 (16)  Gender: 1  Obstructive Sleep Apnea Score 4  Score 4 or greater  Results sent to PCP

## 2013-03-20 MED ORDER — CEFAZOLIN SODIUM-DEXTROSE 2-3 GM-% IV SOLR
2.0000 g | INTRAVENOUS | Status: AC
  Administered 2013-03-21: 2 g via INTRAVENOUS
  Filled 2013-03-20: qty 50

## 2013-03-21 ENCOUNTER — Ambulatory Visit (HOSPITAL_COMMUNITY)
Admission: RE | Admit: 2013-03-21 | Discharge: 2013-03-21 | Disposition: A | Discharge status: Home / Self Care | Payer: Medicare Other | Source: Ambulatory Visit | Attending: Surgery | Admitting: Surgery

## 2013-03-21 ENCOUNTER — Encounter (HOSPITAL_COMMUNITY): Payer: Medicare Other | Admitting: Vascular Surgery

## 2013-03-21 ENCOUNTER — Encounter (HOSPITAL_COMMUNITY)
Admission: RE | Disposition: A | Discharge status: Home / Self Care | Payer: Self-pay | Source: Ambulatory Visit | Attending: Surgery

## 2013-03-21 ENCOUNTER — Ambulatory Visit (HOSPITAL_COMMUNITY): Payer: Medicare Other | Admitting: Anesthesiology

## 2013-03-21 ENCOUNTER — Encounter (HOSPITAL_COMMUNITY): Payer: Self-pay | Admitting: *Deleted

## 2013-03-21 DIAGNOSIS — K4090 Unilateral inguinal hernia, without obstruction or gangrene, not specified as recurrent: Secondary | ICD-10-CM | POA: Insufficient documentation

## 2013-03-21 DIAGNOSIS — I10 Essential (primary) hypertension: Secondary | ICD-10-CM | POA: Insufficient documentation

## 2013-03-21 DIAGNOSIS — D176 Benign lipomatous neoplasm of spermatic cord: Secondary | ICD-10-CM | POA: Insufficient documentation

## 2013-03-21 DIAGNOSIS — K5730 Diverticulosis of large intestine without perforation or abscess without bleeding: Secondary | ICD-10-CM | POA: Insufficient documentation

## 2013-03-21 DIAGNOSIS — K838 Other specified diseases of biliary tract: Secondary | ICD-10-CM | POA: Insufficient documentation

## 2013-03-21 DIAGNOSIS — Z87891 Personal history of nicotine dependence: Secondary | ICD-10-CM | POA: Insufficient documentation

## 2013-03-21 DIAGNOSIS — K648 Other hemorrhoids: Secondary | ICD-10-CM | POA: Insufficient documentation

## 2013-03-21 DIAGNOSIS — Z79899 Other long term (current) drug therapy: Secondary | ICD-10-CM | POA: Insufficient documentation

## 2013-03-21 DIAGNOSIS — Z7982 Long term (current) use of aspirin: Secondary | ICD-10-CM | POA: Insufficient documentation

## 2013-03-21 DIAGNOSIS — G479 Sleep disorder, unspecified: Secondary | ICD-10-CM | POA: Insufficient documentation

## 2013-03-21 DIAGNOSIS — E785 Hyperlipidemia, unspecified: Secondary | ICD-10-CM | POA: Insufficient documentation

## 2013-03-21 DIAGNOSIS — K219 Gastro-esophageal reflux disease without esophagitis: Secondary | ICD-10-CM | POA: Insufficient documentation

## 2013-03-21 DIAGNOSIS — G894 Chronic pain syndrome: Secondary | ICD-10-CM | POA: Insufficient documentation

## 2013-03-21 DIAGNOSIS — M549 Dorsalgia, unspecified: Secondary | ICD-10-CM | POA: Insufficient documentation

## 2013-03-21 HISTORY — PX: INSERTION OF MESH: SHX5868

## 2013-03-21 HISTORY — PX: INGUINAL HERNIA REPAIR: SHX194

## 2013-03-21 SURGERY — REPAIR, HERNIA, INGUINAL, ADULT
Anesthesia: General | Laterality: Right | Wound class: Clean

## 2013-03-21 MED ORDER — FENTANYL CITRATE 0.05 MG/ML IJ SOLN
50.0000 ug | Freq: Once | INTRAMUSCULAR | Status: AC
  Administered 2013-03-21: 100 ug via INTRAVENOUS
  Filled 2013-03-21: qty 2

## 2013-03-21 MED ORDER — HYDROMORPHONE HCL PF 1 MG/ML IJ SOLN
0.2500 mg | INTRAMUSCULAR | Status: DC | PRN
  Administered 2013-03-21: 0.5 mg via INTRAVENOUS

## 2013-03-21 MED ORDER — LACTATED RINGERS IV SOLN
INTRAVENOUS | Status: DC | PRN
  Administered 2013-03-21: 10:00:00 via INTRAVENOUS

## 2013-03-21 MED ORDER — OXYCODONE HCL 5 MG PO TABS: 5.0000 mg | ORAL_TABLET | Freq: Once | ORAL | Status: DC | PRN

## 2013-03-21 MED ORDER — BUPIVACAINE HCL 0.25 % IJ SOLN
INTRAMUSCULAR | Status: DC | PRN
  Administered 2013-03-21: 30 mL

## 2013-03-21 MED ORDER — ONDANSETRON HCL 4 MG/2ML IJ SOLN: 4.0000 mg | INTRAMUSCULAR | Status: DC | PRN

## 2013-03-21 MED ORDER — EPHEDRINE SULFATE 50 MG/ML IJ SOLN
INTRAMUSCULAR | Status: DC | PRN
  Administered 2013-03-21 (×2): 10 mg via INTRAVENOUS

## 2013-03-21 MED ORDER — ROCURONIUM BROMIDE 100 MG/10ML IV SOLN
INTRAVENOUS | Status: DC | PRN
  Administered 2013-03-21: 50 mg via INTRAVENOUS

## 2013-03-21 MED ORDER — NEOSTIGMINE METHYLSULFATE 1 MG/ML IJ SOLN
INTRAMUSCULAR | Status: DC | PRN
  Administered 2013-03-21: 3 mg via INTRAVENOUS

## 2013-03-21 MED ORDER — GLYCOPYRROLATE 0.2 MG/ML IJ SOLN
INTRAMUSCULAR | Status: DC | PRN
  Administered 2013-03-21: 0.2 mg via INTRAVENOUS
  Administered 2013-03-21: 0.4 mg via INTRAVENOUS

## 2013-03-21 MED ORDER — PROPOFOL 10 MG/ML IV BOLUS
INTRAVENOUS | Status: DC | PRN
  Administered 2013-03-21: 200 mg via INTRAVENOUS

## 2013-03-21 MED ORDER — OXYCODONE-ACETAMINOPHEN 5-325 MG PO TABS
1.0000 | ORAL_TABLET | ORAL | Status: DC | PRN
  Administered 2013-03-21: 1 via ORAL

## 2013-03-21 MED ORDER — MIDAZOLAM HCL 5 MG/5ML IJ SOLN
INTRAMUSCULAR | Status: DC | PRN
  Administered 2013-03-21: 2 mg via INTRAVENOUS

## 2013-03-21 MED ORDER — OXYCODONE HCL 5 MG/5ML PO SOLN: 5.0000 mg | Freq: Once | ORAL | Status: DC | PRN

## 2013-03-21 MED ORDER — OXYCODONE-ACETAMINOPHEN 5-325 MG PO TABS: 1.0000 | ORAL_TABLET | ORAL | Status: DC | PRN

## 2013-03-21 MED ORDER — OXYCODONE-ACETAMINOPHEN 5-325 MG PO TABS
ORAL_TABLET | ORAL | Status: AC
  Filled 2013-03-21: qty 1

## 2013-03-21 MED ORDER — MORPHINE SULFATE 2 MG/ML IJ SOLN
2.0000 mg | INTRAMUSCULAR | Status: DC | PRN
  Administered 2013-03-21: 2 mg via INTRAVENOUS

## 2013-03-21 MED ORDER — PROMETHAZINE HCL 25 MG/ML IJ SOLN: 6.2500 mg | INTRAMUSCULAR | Status: DC | PRN

## 2013-03-21 MED ORDER — BUPIVACAINE-EPINEPHRINE PF 0.5-1:200000 % IJ SOLN
INTRAMUSCULAR | Status: AC
  Filled 2013-03-21: qty 30

## 2013-03-21 MED ORDER — MORPHINE SULFATE 2 MG/ML IJ SOLN
INTRAMUSCULAR | Status: AC
  Filled 2013-03-21: qty 1

## 2013-03-21 MED ORDER — LIDOCAINE HCL (CARDIAC) 20 MG/ML IV SOLN
INTRAVENOUS | Status: DC | PRN
  Administered 2013-03-21: 50 mg via INTRAVENOUS

## 2013-03-21 MED ORDER — HYDROMORPHONE HCL PF 1 MG/ML IJ SOLN
INTRAMUSCULAR | Status: AC
  Filled 2013-03-21: qty 1

## 2013-03-21 MED ORDER — FENTANYL CITRATE 0.05 MG/ML IJ SOLN
INTRAMUSCULAR | Status: DC | PRN
  Administered 2013-03-21: 50 ug via INTRAVENOUS
  Administered 2013-03-21: 100 ug via INTRAVENOUS
  Administered 2013-03-21: 50 ug via INTRAVENOUS

## 2013-03-21 MED ORDER — MIDAZOLAM HCL 2 MG/2ML IJ SOLN
1.0000 mg | INTRAMUSCULAR | Status: DC | PRN
  Administered 2013-03-21: 2 mg via INTRAVENOUS
  Filled 2013-03-21: qty 2

## 2013-03-21 MED ORDER — BUPIVACAINE-EPINEPHRINE PF 0.5-1:200000 % IJ SOLN
INTRAMUSCULAR | Status: DC | PRN
  Administered 2013-03-21: 25 mL

## 2013-03-21 MED ORDER — DEXAMETHASONE SODIUM PHOSPHATE 4 MG/ML IJ SOLN
INTRAMUSCULAR | Status: DC | PRN
  Administered 2013-03-21: 4 mg

## 2013-03-21 MED ORDER — BUPIVACAINE HCL (PF) 0.25 % IJ SOLN
INTRAMUSCULAR | Status: AC
  Filled 2013-03-21: qty 30

## 2013-03-21 MED ORDER — LACTATED RINGERS IV SOLN
INTRAVENOUS | Status: DC
  Administered 2013-03-21: 10:00:00 via INTRAVENOUS

## 2013-03-21 SURGICAL SUPPLY — 50 items
BENZOIN TINCTURE PRP APPL 2/3 (GAUZE/BANDAGES/DRESSINGS) ×2 IMPLANT
BLADE SURG 10 STRL SS (BLADE) ×2 IMPLANT
BLADE SURG 15 STRL LF DISP TIS (BLADE) ×1 IMPLANT
BLADE SURG 15 STRL SS (BLADE) ×1
BLADE SURG ROTATE 9660 (MISCELLANEOUS) ×2 IMPLANT
CHLORAPREP W/TINT 26ML (MISCELLANEOUS) ×2 IMPLANT
COVER SURGICAL LIGHT HANDLE (MISCELLANEOUS) ×2 IMPLANT
DECANTER SPIKE VIAL GLASS SM (MISCELLANEOUS) ×2 IMPLANT
DRAIN PENROSE 1/2X12 LTX STRL (WOUND CARE) ×2 IMPLANT
DRAPE LAPAROSCOPIC ABDOMINAL (DRAPES) IMPLANT
DRAPE LAPAROTOMY TRNSV 102X78 (DRAPE) ×2 IMPLANT
DRAPE UTILITY 15X26 W/TAPE STR (DRAPE) ×4 IMPLANT
DRSG TEGADERM 4X4.75 (GAUZE/BANDAGES/DRESSINGS) ×2 IMPLANT
ELECT CAUTERY BLADE 6.4 (BLADE) ×2 IMPLANT
ELECT REM PT RETURN 9FT ADLT (ELECTROSURGICAL) ×2
ELECTRODE REM PT RTRN 9FT ADLT (ELECTROSURGICAL) ×1 IMPLANT
GAUZE SPONGE 4X4 16PLY XRAY LF (GAUZE/BANDAGES/DRESSINGS) ×2 IMPLANT
GLOVE BIO SURGEON STRL SZ7 (GLOVE) ×2 IMPLANT
GLOVE BIO SURGEON STRL SZ7.5 (GLOVE) ×2 IMPLANT
GLOVE BIOGEL PI IND STRL 7.0 (GLOVE) ×1 IMPLANT
GLOVE BIOGEL PI IND STRL 7.5 (GLOVE) ×2 IMPLANT
GLOVE BIOGEL PI INDICATOR 7.0 (GLOVE) ×1
GLOVE BIOGEL PI INDICATOR 7.5 (GLOVE) ×2
GOWN STRL NON-REIN LRG LVL3 (GOWN DISPOSABLE) ×4 IMPLANT
KIT BASIN OR (CUSTOM PROCEDURE TRAY) ×2 IMPLANT
KIT ROOM TURNOVER OR (KITS) ×2 IMPLANT
MESH PARIETEX PROGRIP RIGHT (Mesh General) ×2 IMPLANT
NEEDLE HYPO 25GX1X1/2 BEV (NEEDLE) ×2 IMPLANT
NS IRRIG 1000ML POUR BTL (IV SOLUTION) ×2 IMPLANT
PACK SURGICAL SETUP 50X90 (CUSTOM PROCEDURE TRAY) ×2 IMPLANT
PAD ARMBOARD 7.5X6 YLW CONV (MISCELLANEOUS) ×2 IMPLANT
PENCIL BUTTON HOLSTER BLD 10FT (ELECTRODE) ×2 IMPLANT
SPECIMEN JAR SMALL (MISCELLANEOUS) ×2 IMPLANT
SPONGE GAUZE 4X4 12PLY (GAUZE/BANDAGES/DRESSINGS) ×2 IMPLANT
SPONGE INTESTINAL PEANUT (DISPOSABLE) ×2 IMPLANT
STRIP CLOSURE SKIN 1/2X4 (GAUZE/BANDAGES/DRESSINGS) ×2 IMPLANT
SUT MNCRL AB 4-0 PS2 18 (SUTURE) ×2 IMPLANT
SUT PDS AB 0 CT 36 (SUTURE) IMPLANT
SUT PROLENE 2 0 SH DA (SUTURE) IMPLANT
SUT SILK 2 0 SH (SUTURE) IMPLANT
SUT SILK 3 0 (SUTURE) ×1
SUT SILK 3-0 18XBRD TIE 12 (SUTURE) ×1 IMPLANT
SUT VIC AB 0 CT2 27 (SUTURE) ×6 IMPLANT
SUT VIC AB 2-0 SH 27 (SUTURE) ×1
SUT VIC AB 2-0 SH 27X BRD (SUTURE) ×1 IMPLANT
SUT VIC AB 3-0 SH 27 (SUTURE) ×1
SUT VIC AB 3-0 SH 27XBRD (SUTURE) ×1 IMPLANT
SYR CONTROL 10ML LL (SYRINGE) ×2 IMPLANT
TOWEL OR 17X24 6PK STRL BLUE (TOWEL DISPOSABLE) ×2 IMPLANT
TOWEL OR 17X26 10 PK STRL BLUE (TOWEL DISPOSABLE) ×2 IMPLANT

## 2013-03-21 NOTE — OR Nursing (Addendum)
03/20/2013 1025: Wedding band on left ring finger present during preop assessment. Patient states "it has been on since he was married in the 20's and it does not come off." I informed patient and wife of risk of burn related to keeping the ring off and he had a choice for [place] to cut it off or with risk, keep it on. Patient did not want to cut ring off.  Applied clear tape over ring and informed Gwenyth Allegra, CRNA of situation.

## 2013-03-21 NOTE — Transfer of Care (Signed)
Immediate Anesthesia Transfer of Care Note  Patient: Kurt Parsons  Procedure(s) Performed: Procedure(s): HERNIA REPAIR INGUINAL ADULT (Right) INSERTION OF MESH (Right)  Patient Location: PACU  Anesthesia Type:General  Level of Consciousness: awake, alert , oriented and patient cooperative  Airway & Oxygen Therapy: Patient Spontanous Breathing and Patient connected to nasal cannula oxygen  Post-op Assessment: Report given to PACU RN and Post -op Vital signs reviewed and stable  Post vital signs: Reviewed and stable  Complications: No apparent anesthesia complications

## 2013-03-21 NOTE — Interval H&P Note (Signed)
History and Physical Interval Note:  03/21/2013 10:10 AM  Kurt Parsons  has presented today for surgery, with the diagnosis of hernia  The various methods of treatment have been discussed with the patient and family. After consideration of risks, benefits and other options for treatment, the patient has consented to  Procedure(s): HERNIA REPAIR INGUINAL ADULT (N/A) INSERTION OF MESH (N/A) as a surgical intervention .  The patient's history has been reviewed, patient examined, no change in status, stable for surgery.  I have reviewed the patient's chart and labs.  Questions were answered to the patient's satisfaction.     Kurt Parsons K.

## 2013-03-21 NOTE — Anesthesia Postprocedure Evaluation (Signed)
  Anesthesia Post-op Note  Patient: Kurt Parsons  Procedure(s) Performed: Procedure(s): HERNIA REPAIR INGUINAL ADULT (Right) INSERTION OF MESH (Right)  Patient Location: PACU  Anesthesia Type:GA combined with regional for post-op pain  Level of Consciousness: awake and alert   Airway and Oxygen Therapy: Patient Spontanous Breathing  Post-op Pain: mild  Post-op Assessment: Post-op Vital signs reviewed, Patient's Cardiovascular Status Stable, Respiratory Function Stable, Patent Airway, No signs of Nausea or vomiting and Pain level controlled  Post-op Vital Signs: stable  Complications: No apparent anesthesia complications

## 2013-03-21 NOTE — Anesthesia Preprocedure Evaluation (Signed)
Anesthesia Evaluation  Patient identified by MRN, date of birth, ID band Patient awake    Reviewed: Allergy & Precautions, H&P , NPO status , Patient's Chart, lab work & pertinent test results  Airway Mallampati: II TM Distance: >3 FB     Dental   Pulmonary  breath sounds clear to auscultation        Cardiovascular hypertension, Rhythm:Regular Rate:Normal     Neuro/Psych  Neuromuscular disease    GI/Hepatic GERD-  ,  Endo/Other    Renal/GU      Musculoskeletal   Abdominal   Peds  Hematology   Anesthesia Other Findings   Reproductive/Obstetrics                           Anesthesia Physical Anesthesia Plan  ASA: II  Anesthesia Plan: General   Post-op Pain Management:    Induction: Intravenous  Airway Management Planned: Oral ETT  Additional Equipment:   Intra-op Plan:   Post-operative Plan: Extubation in OR  Informed Consent: I have reviewed the patients History and Physical, chart, labs and discussed the procedure including the risks, benefits and alternatives for the proposed anesthesia with the patient or authorized representative who has indicated his/her understanding and acceptance.     Plan Discussed with: CRNA and Surgeon  Anesthesia Plan Comments:         Anesthesia Quick Evaluation

## 2013-03-21 NOTE — H&P (View-Only) (Signed)
Patient ID: Kurt Parsons, male   DOB: 10-Jul-1945, 67 y.o.   MRN: [id number]  Chief Complaint  Patient presents with  . New Evaluation    eval RIH    HPI Kurt Parsons is a 67 y.o. male.  Referred by Dr. Kriste Basque for evaluation of right inguinal hernia HPI This is a 67 year old male who presents with a six-month history of pain in his right lower quadrant down into his groin. He was evaluated by Dr. [name] for this pain. She performed a colonoscopy which was reportedly normal.  A CT scan showed no significant findings other than a right inguinal hernia containing fat. This area has enlarged and now has swelling radiating down to his testicle.He presents now for surgical evaluation.  Past Medical History  Diagnosis Date  . Lazy eye rt eye  . Hyperlipidemia   . ED (erectile dysfunction)   . Sleep disturbance   . Hypertension   . Chronic pain syndrome   . Diverticulosis   . Ulcer   . Duodenal obstruction     partial duodenal outlet  . Neuropathy   . GERD (gastroesophageal reflux disease)   . Chronic back pain   . Inguinal hernia   . Liver cyst   . Right hydrocele   . Gallbladder sludge   . Internal hemorrhoids     Past Surgical History  Procedure Laterality Date  . Cervical disc surgery      x 2  . Lumbar disc surgery      x 2  . Colonoscopy    . Neck surgery    . Back surgery    . Hemorrhoid surgery      Family History  Problem Relation Age of Onset  . Diabetes    . Hypertension    . Stroke    . Heart disease    . COPD      Social History History  Substance Use Topics  . Smoking status: Former Smoker    Quit date: 06/07/1974  . Smokeless tobacco: Never Used  . Alcohol Use: No    No Known Allergies  Current Outpatient Prescriptions  Medication Sig Dispense Refill  . aspirin EC 81 MG tablet Take 1 tablet (81 mg total) by mouth daily.      . citalopram (CELEXA) 20 MG tablet Take 1 tablet (20 mg total) by mouth daily.  100 tablet  3  . gabapentin  (NEURONTIN) 800 MG tablet Take 1 tablet (800 mg total) by mouth 2 (two) times daily.  200 tablet  3  . lisinopril (PRINIVIL,ZESTRIL) 10 MG tablet Take 1 tablet (10 mg total) by mouth daily.  90 tablet  3  . morphine (MSIR) 15 MG tablet       . omeprazole (PRILOSEC) 40 MG capsule Take 1 capsule (40 mg total) by mouth daily.  100 capsule  3  . simvastatin (ZOCOR) 20 MG tablet Take 1 tablet (20 mg total) by mouth at bedtime.  100 tablet  3  . zolpidem (AMBIEN) 5 MG tablet take 1/2 tablet by mouth at bedtime if needed for sleep  30 tablet  5  . Docusate Sodium (COLACE PO) Take by mouth.      Marland Kitchen HYDROcodone-homatropine (HYCODAN) 5-1.5 MG/5ML syrup take 1/2 teaspoonful by mouth three times a day if needed  120 mL  1   No current facility-administered medications for this visit.    Review of Systems Review of Systems  Constitutional: Negative for fever, chills and unexpected weight  change.  HENT: Negative for hearing loss, congestion, sore throat, trouble swallowing and voice change.   Eyes: Negative for visual disturbance.  Respiratory: Negative for cough and wheezing.   Cardiovascular: Negative for chest pain, palpitations and leg swelling.  Gastrointestinal: Positive for constipation. Negative for nausea, vomiting, abdominal pain, diarrhea, blood in stool, abdominal distention, anal bleeding and rectal pain.  Genitourinary: Positive for scrotal swelling and testicular pain. Negative for hematuria and difficulty urinating.  Musculoskeletal: Negative for arthralgias.  Skin: Negative for rash and wound.  Neurological: Negative for seizures, syncope, weakness and headaches.  Hematological: Negative for adenopathy. Does not bruise/bleed easily.  Psychiatric/Behavioral: Negative for confusion.    Blood pressure 118/72, pulse 66, temperature 97.6 F (36.4 C), temperature source Temporal, resp. rate 14, height 5\' 11"  (1.803 m), weight 201 lb 3.2 oz (91.264 kg).  Physical Exam Physical Exam WDWN  in NAD HEENT:  EOMI, sclera anicteric Neck:  No masses, no thyromegaly Lungs:  CTA bilaterally; normal respiratory effort CV:  Regular rate and rhythm; no murmurs Abd:  +bowel sounds, soft, non-tender, no masses GU:  Bilateral descended testes; no testicular masses; moderate right inguinal hernia - reducible; no sign of left inguinal hernia Ext:  Well-perfused; no edema Skin:  Warm, dry; no sign of jaundice  Data Reviewed RADIOLOGY REPORT*  Clinical Data: Right lower quadrant pain  CT ABDOMEN AND PELVIS WITH CONTRAST  Technique: Multidetector CT imaging of the abdomen and pelvis was  performed following the standard protocol during bolus  administration of intravenous contrast.  Contrast: OMNIPAQUE IOHEXOL 300 MG/ML SOLN  Comparison: 10/25/2007  Findings: The lung bases are clear. No pleural or pericardial  effusion identified.  Fluid attenuating structure in the left hepatic lobe is stable  measuring 10 mm. There is a fluid attenuating structure in the  periphery of the right hepatic lobe which is stable measuring 1.3  cm. Sludge and/or stones are noted within the gallbladder. No  gallbladder wall thickening or pericholecystic fluid. The pancreas  is normal. Normal appearance of the spleen.  The adrenal glands are both normal. There is a cyst within the  inferior pole of the right kidney which measures 1.2 cm, image  34/series 2. The left kidney appears normal. The urinary bladder  is unremarkable. Prostate gland appears enlarged and has mass  effect upon the bladder base. A right-sided hydrocele is noted.  Normal caliber of the abdominal aorta. There is calcified  atherosclerotic disease. No upper abdominal adenopathy identified.  There is no pelvic or inguinal adenopathy noted. No free fluid or  fluid collections identified within the abdomen or the pelvis.  The stomach appears normal. The small bowel loops are  unremarkable. Normal appearance of the proximal colon.  There is  multiple distal colonic diverticula without acute inflammation.  The appendix is visualized and appears within normal limits.  Right inguinal hernia is present which contains fat.  Review of the visualized bony structures is significant for mild  scoliosis which is convex towards the left. Degenerative disc  disease is identified at the L 34.  IMPRESSION:  1. No acute findings within the abdomen or pelvis.  2. The appendix is visualized and appears normal.  3. Right inguinal hernia containing fat.  4. Right hydrocele.  5. Liver cysts.  6. Sludge and/or stones within the gallbladder.  Original Report Authenticated By: Signa Kell, M.D.   Assessment    Reducible right inguinal hernia     Plan    Right inguinal hernia repair with  mesh.  The surgical procedure has been discussed with the patient.  Potential risks, benefits, alternative treatments, and expected outcomes have been explained.  All of the patient's questions at this time have been answered.  The likelihood of reaching the patient's treatment goal is good.  The patient understand the proposed surgical procedure and wishes to proceed.         Nury Nebergall K. 02/23/2013, 11:17 AM

## 2013-03-21 NOTE — Op Note (Signed)
Hernia, Open, Procedure Note  Indications: The patient presented with a history of a right, reducible inguinal hernia.    Pre-operative Diagnosis: right reducible inguinal hernia Post-operative Diagnosis: same  Surgeon: Wynona Luna.   Assistants: none  Anesthesia: General endotracheal anesthesia and TAP block  ASA Class: 2  Procedure Details  The patient was seen again in the Holding Room. The risks, benefits, complications, treatment options, and expected outcomes were discussed with the patient. The possibilities of reaction to medication, pulmonary aspiration, perforation of viscus, bleeding, recurrent infection, the need for additional procedures, and development of a complication requiring transfusion or further operation were discussed with the patient and/or family. The likelihood of success in repairing the hernia and returning the patient to their previous functional status is good.  There was concurrence with the proposed plan, and informed consent was obtained. The site of surgery was properly noted/marked. The patient was taken to the Operating Room, identified as Kurt Parsons, and the procedure verified as right inguinal hernia repair. A Time Out was held and the above information confirmed.  The patient was placed in the supine position and underwent induction of anesthesia. The lower abdomen and groin was prepped with Chloraprep and draped in the standard fashion, and 0.25% Marcaine with epinephrine was used to anesthetize the skin over the mid-portion of the inguinal canal. An oblique incision was made. Dissection was carried down through the subcutaneous tissue with cautery to the external oblique fascia.  We opened the external oblique fascia along the direction of its fibers to the external ring.  The spermatic cord was circumferentially dissected bluntly and retracted with a Penrose drain.  The ilioinguinal nerve was identified and preserved.  The floor of the inguinal  canal was inspected and was intact.  We skeletonized the spermatic cord and reduced two large cord lipomas and a small indirect hernia sac.  The internal ring was tightened with 0 Vicryl  We used a right-sided Progrip mesh which was inserted and deployed across the floor of the inguinal canal. The mesh was tucked underneath the external oblique fascia laterally.  The flap of the mesh was closed around the spermatic cord to recreate the internal inguinal ring.  The mesh was secured to the pubic tubercle with 0 Vicryl.  The external oblique fascia was reapproximated with 2-0 Vicryl.  3-0 Vicryl was used to close the subcutaneous tissues and 4-0 Monocryl was used to close the skin in subcuticular fashion.  Benzoin and steri-strips were used to seal the incision.  A clean dressing was applied.  The patient was then extubated and brought to the recovery room in stable condition.  All sponge, instrument, and needle counts were correct prior to closure and at the conclusion of the case.   Estimated Blood Loss: Minimal                 Complications: None; patient tolerated the procedure well.         Disposition: PACU - hemodynamically stable.         Condition: stable  Kurt Parsons. Kurt Skains, MD, [facility]  General/ Trauma Surgery  03/21/2013 11:58 AM

## 2013-03-21 NOTE — Preoperative (Signed)
Beta Blockers   Reason not to administer Beta Blockers:Not Applicable 

## 2013-03-21 NOTE — Anesthesia Procedure Notes (Signed)
Anesthesia Regional Block:  TAP block  Pre-Anesthetic Checklist: ,, timeout performed, Correct Patient, Correct Site, Correct Laterality, Correct Procedure, Correct Position, site marked, Risks and benefits discussed,  Surgical consent,  Pre-op evaluation,  At surgeon's request and post-op pain management  Laterality: Right  Prep: chloraprep       Needles:   Needle Type: Echogenic Stimulator Needle     Needle Length:cm 9 cm Needle Gauge: 22 and 22 G    Additional Needles:  Procedures: ultrasound guided (picture in chart) TAP block Narrative:  Start time: 03/21/2013 10:01 AM End time: 03/21/2013 10:10 AM Injection made incrementally with aspirations every 5 mL. Anesthesiologist: Dr Gypsy Balsam  Additional Notes: 1001-1010 R TAP Block POP CHG prep, sterile tech #22 echo needle with good [place] visualization-PIX in chart Marc .5% w/epi 1:200000 total 25cc+ decadron 4mg  infiltrated Multiple neg asp No compl Dr Gypsy Balsam

## 2013-03-23 ENCOUNTER — Encounter (HOSPITAL_COMMUNITY): Payer: Self-pay | Admitting: Surgery

## 2013-03-27 ENCOUNTER — Telehealth (INDEPENDENT_AMBULATORY_CARE_PROVIDER_SITE_OTHER): Payer: Self-pay | Admitting: *Deleted

## 2013-03-27 NOTE — Telephone Encounter (Signed)
Patient's wife called to report that patient is feeling very tired and doesn't feel like getting up to do anything.  She reports that patient just lays around.  Explained that patient really needs to be getting up and moving around.  Encouraged her to have patient go outside for sunshine.  She states that patient does go outside however he just sleeps.  Explained to wife that maybe patient needs to back off on narcotic pain medication if all he is doing is sleeping however wife states he needs it for pain control.  Explained that I'm not sure what the wife is asking for.  I stressed to the wife that patient really needs to try to get up moving around and sitting around will only increase the pain and fatigue.  Wife states that she can't carry him.  I said I understand that but try to encourage him as much as possible.  Wife states ok then hangs up.

## 2013-03-27 NOTE — Telephone Encounter (Signed)
The patient's wife called back again.  She concerned about him not healing like he should.  She is sleeping all the time.  He is easy to awake and is breathing fine.  He is eating and drinking ok.  His is voiding and moving his bowels.  He is having a lot of pain.  He was on Morphine and Valium from his pain management dr at [facility] then he stopped that.  He is now on Oxycodone.  He just can't get up and move around like he should.  She would like him to be checked in the office.  Dr Fatima Sanger office is booked tomorrow so I told her I will send a message to have him or Pattricia Boss call back with an appointment.

## 2013-03-28 NOTE — Telephone Encounter (Signed)
Spoke to patient this morning who does want to be seen Friday.  Appt scheduled at this time and patient agreeable.

## 2013-03-28 NOTE — Telephone Encounter (Signed)
There was nothing unusual about his surgery.  Most people are very sore the first week or so after surgery.  His situation is complicated by his narcotic dependence.  I can see him on Friday if needed.

## 2013-03-30 ENCOUNTER — Encounter (INDEPENDENT_AMBULATORY_CARE_PROVIDER_SITE_OTHER): Payer: Self-pay | Admitting: Surgery

## 2013-03-30 ENCOUNTER — Ambulatory Visit (INDEPENDENT_AMBULATORY_CARE_PROVIDER_SITE_OTHER): Payer: Medicare Other | Admitting: Surgery

## 2013-03-30 VITALS — BP 130/78 | HR 64 | Temp 97.0°F | Resp 18 | Ht 71.0 in | Wt 203.6 lb

## 2013-03-30 DIAGNOSIS — K4090 Unilateral inguinal hernia, without obstruction or gangrene, not specified as recurrent: Secondary | ICD-10-CM

## 2013-03-30 NOTE — Progress Notes (Signed)
Status post right inguinal hernia repair with mesh On 03/21/13. The patient came in early for a postoperative evaluation because of the discomfort that he is feeling in his scrotum. Appetite and bowel movements are normal. He is no longer taking pain medication.  Filed Vitals:   03/30/13 1041  BP: 130/78  Pulse: 64  Temp: 97 F (36.1 C)  Resp: 18   His right inguinal incision is well-healed with no sign of infection. He does have a firm healing ridge under the incision. Minimal swelling in the upper scrotum. No sign of recurrent hernia.  The amount of swelling and discomfort that he is experiencing is typical for 1 week postop. There are no apparent complications or problems with the surgery. He should continue limiting his level of activity for the next few weeks. He may followup with [place] as needed.  Wilmon Arms. Corliss Skains, MD, [facility]  General/ Trauma Surgery  03/30/2013 11:46 AM

## 2013-04-09 ENCOUNTER — Telehealth: Payer: Self-pay | Admitting: *Deleted

## 2013-04-09 MED ORDER — LORAZEPAM 0.5 MG PO TABS: ORAL_TABLET | ORAL | Status: DC

## 2013-04-09 NOTE — Telephone Encounter (Signed)
Due to a drug interaction,  Ambien will be switched to ativan.  Patient is aware and a new Rx called into pharmacy.

## 2013-04-10 ENCOUNTER — Encounter (INDEPENDENT_AMBULATORY_CARE_PROVIDER_SITE_OTHER): Payer: Medicare Other | Admitting: Surgery

## 2013-04-12 ENCOUNTER — Other Ambulatory Visit: Payer: Self-pay

## 2013-07-10 ENCOUNTER — Encounter: Payer: Self-pay | Admitting: Family Medicine

## 2013-07-10 ENCOUNTER — Ambulatory Visit (INDEPENDENT_AMBULATORY_CARE_PROVIDER_SITE_OTHER): Payer: Medicare Other | Admitting: Family Medicine

## 2013-07-10 VITALS — BP 120/80 | Temp 97.7°F | Ht 70.0 in | Wt 204.0 lb

## 2013-07-10 DIAGNOSIS — Z23 Encounter for immunization: Secondary | ICD-10-CM

## 2013-07-10 DIAGNOSIS — K219 Gastro-esophageal reflux disease without esophagitis: Secondary | ICD-10-CM

## 2013-07-10 DIAGNOSIS — Z0000 Encounter for general adult medical examination without abnormal findings: Secondary | ICD-10-CM

## 2013-07-10 DIAGNOSIS — R946 Abnormal results of thyroid function studies: Secondary | ICD-10-CM

## 2013-07-10 DIAGNOSIS — R7989 Other specified abnormal findings of blood chemistry: Secondary | ICD-10-CM

## 2013-07-10 DIAGNOSIS — I10 Essential (primary) hypertension: Secondary | ICD-10-CM

## 2013-07-10 DIAGNOSIS — M549 Dorsalgia, unspecified: Secondary | ICD-10-CM

## 2013-07-10 DIAGNOSIS — G608 Other hereditary and idiopathic neuropathies: Secondary | ICD-10-CM

## 2013-07-10 DIAGNOSIS — F528 Other sexual dysfunction not due to a substance or known physiological condition: Secondary | ICD-10-CM

## 2013-07-10 DIAGNOSIS — E782 Mixed hyperlipidemia: Secondary | ICD-10-CM

## 2013-07-10 MED ORDER — OMEPRAZOLE 40 MG PO CPDR: 40.0000 mg | DELAYED_RELEASE_CAPSULE | Freq: Every day | ORAL | Status: DC

## 2013-07-10 MED ORDER — CITALOPRAM HYDROBROMIDE 20 MG PO TABS: 20.0000 mg | ORAL_TABLET | Freq: Every day | ORAL | Status: DC

## 2013-07-10 MED ORDER — SIMVASTATIN 20 MG PO TABS: 20.0000 mg | ORAL_TABLET | Freq: Every day | ORAL | Status: DC

## 2013-07-10 MED ORDER — GABAPENTIN 800 MG PO TABS: 800.0000 mg | ORAL_TABLET | Freq: Two times a day (BID) | ORAL | Status: DC

## 2013-07-10 MED ORDER — LISINOPRIL 10 MG PO TABS: 10.0000 mg | ORAL_TABLET | Freq: Every day | ORAL | Status: DC

## 2013-07-10 NOTE — Progress Notes (Signed)
Pre visit review using our clinic review tool, if applicable. No additional management support is needed unless otherwise documented below in the visit note. 

## 2013-07-10 NOTE — Patient Instructions (Signed)
Continue your current medications  I will call you about your lab work a couple days  Remember to walk 30 minutes daily  I would go to the audiology department at [facility],,,,,,,,,, have an audiogram and get fitted for a hearing aid!!!!!!!!!!!,,,,,, you'll be better able to hear your grandchildren!!!!  Return in one year for general physical sooner if any problems

## 2013-07-10 NOTE — Progress Notes (Signed)
   Subjective:    Patient ID: Abagail Kitchens, male    DOB: 15-May-1946, 68 y.o.   MRN: [id number]  HPI mable is a 68 year old married male nonsmoker who comes in today for a Medicare wellness examination because of a history of mild depression, neuropathy, hypertension, reflux esophagitis, hyperlipidemia and a new problem of worsening hearing loss  His med list reviewed the been no changes except she's not taking the pain pills nor the Ativan.  He had a right inguinal hernia  This past fall.  His hearing skating worse. I recommend he go to [place].  Cognitive function normal he walks on a regular basis home health safety reviewed no issues identified, no guns in the house, he does have a health care power of attorney and living well  He spends 3-4 days per week in [place] helping care for his grandchildren who are 4 and 1  Vaccinations up-to-date   Review of Systems  Constitutional: Negative.   HENT: Positive for hearing loss.   Eyes: Negative.   Respiratory: Negative.   Cardiovascular: Negative.   Gastrointestinal: Negative.   Endocrine: Negative.   Genitourinary: Negative.   Musculoskeletal: Negative.   Skin: Negative.   Allergic/Immunologic: Negative.   Neurological: Negative.   Hematological: Negative.   Psychiatric/Behavioral: Negative.        Objective:   Physical Exam  Nursing note and vitals reviewed. Constitutional: He is oriented to person, place, and time. He appears well-developed and well-nourished.  HENT:  Head: Normocephalic and atraumatic.  Right Ear: External ear normal.  Left Ear: External ear normal.  Nose: Nose normal.  Mouth/Throat: Oropharynx is clear and moist.  Significant bilateral hearing loss  Eyes: Conjunctivae and EOM are normal. Pupils are equal, round, and reactive to light.  Neck: Normal range of motion. Neck supple. No JVD present. No tracheal deviation present. No thyromegaly present.  Cardiovascular: Normal rate, regular rhythm,  normal heart sounds and intact distal pulses.  Exam reveals no gallop and no friction rub.   No murmur heard. No carotid or bruits peripheral pulses 2+ and symmetrical  Pulmonary/Chest: Effort normal and breath sounds normal. No stridor. No respiratory distress. He has no wheezes. He has no rales. He exhibits no tenderness.  Abdominal: Soft. Bowel sounds are normal. He exhibits no distension and no mass. There is no tenderness. There is no rebound and no guarding.  Genitourinary: Rectum normal, prostate normal and penis normal. Guaiac negative stool. No penile tenderness.  Musculoskeletal: Normal range of motion. He exhibits no edema and no tenderness.  Lymphadenopathy:    He has no cervical adenopathy.  Neurological: He is alert and oriented to person, place, and time. He has normal reflexes. No cranial nerve deficit. He exhibits normal muscle tone.  Skin: Skin is warm and dry. No rash noted. No erythema. No pallor.  Total body skin exam normal scar lumbar cervical and right groin from previous surgical procedures  Psychiatric: He has a normal mood and affect. His behavior is normal. Judgment and thought content normal.          Assessment & Plan:  Healthy male  History of mild depression continue Celexa  Neuropathy continue Neurontin 800 mg twice a day  Hypertension continue lisinopril 10 mg daily  Reflux esophagitis Prilosec 40 mg daily  Hyperlipidemia continue Zocor 20 mg daily  Hearing loss referred to audiology at [facility] post cervical disc surgery  Status post lumbar disc surgery  Status post right inguinal hernia surgery

## 2013-07-11 ENCOUNTER — Telehealth: Payer: Self-pay | Admitting: Family Medicine

## 2013-07-11 LAB — PSA: PSA: 1.32 ng/mL (ref 0.10–4.00)

## 2013-07-11 LAB — POCT URINALYSIS DIPSTICK
Blood, UA: NEGATIVE
Glucose, UA: NEGATIVE
Leukocytes, UA: NEGATIVE
NITRITE UA: NEGATIVE
UROBILINOGEN UA: 1
pH, UA: 5.5

## 2013-07-11 LAB — HEPATIC FUNCTION PANEL
ALBUMIN: 3.8 g/dL (ref 3.5–5.2)
ALT: 22 U/L (ref 0–53)
AST: 18 U/L (ref 0–37)
Alkaline Phosphatase: 76 U/L (ref 39–117)
Bilirubin, Direct: 0.2 mg/dL (ref 0.0–0.3)
Total Bilirubin: 1.3 mg/dL — ABNORMAL HIGH (ref 0.3–1.2)
Total Protein: 6.8 g/dL (ref 6.0–8.3)

## 2013-07-11 LAB — LIPID PANEL
CHOLESTEROL: 153 mg/dL (ref 0–200)
HDL: 46.1 mg/dL (ref >39.00)
LDL Cholesterol: 92 mg/dL (ref 0–99)
TRIGLYCERIDES: 73 mg/dL (ref 0.0–149.0)
Total CHOL/HDL Ratio: 3
VLDL: 14.6 mg/dL (ref 0.0–40.0)

## 2013-07-11 LAB — TSH: TSH: 2.02 u[IU]/mL (ref 0.35–5.50)

## 2013-07-11 NOTE — Telephone Encounter (Signed)
Relevant patient education assigned to patient using Emmi. ° °

## 2013-08-24 ENCOUNTER — Other Ambulatory Visit: Payer: Self-pay | Admitting: Family Medicine

## 2014-04-29 ENCOUNTER — Encounter: Payer: Self-pay | Admitting: Family Medicine

## 2014-04-29 ENCOUNTER — Ambulatory Visit (INDEPENDENT_AMBULATORY_CARE_PROVIDER_SITE_OTHER): Payer: Medicare Other | Admitting: Family Medicine

## 2014-04-29 VITALS — BP 130/84 | Temp 97.8°F | Wt 200.0 lb

## 2014-04-29 DIAGNOSIS — J069 Acute upper respiratory infection, unspecified: Secondary | ICD-10-CM

## 2014-04-29 DIAGNOSIS — B9789 Other viral agents as the cause of diseases classified elsewhere: Principal | ICD-10-CM

## 2014-04-29 MED ORDER — HYDROCODONE-HOMATROPINE 5-1.5 MG/5ML PO SYRP: 5.0000 mL | ORAL_SOLUTION | Freq: Three times a day (TID) | ORAL | Status: DC | PRN

## 2014-04-29 NOTE — Patient Instructions (Signed)
Drink lots of water  Motrin 600 mg twice daily with food  Hydromet 1/2-1 teaspoon 3 times daily when necessary for cough  Return when necessary

## 2014-04-29 NOTE — Progress Notes (Signed)
   Subjective:    Patient ID: Kurt Parsons, male    DOB: 1945/07/11, 68 y.o.   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 His daughter who is a Teacher, music at [employer] advised him to take Motrin which she's been taking with some relief  He's had a cough but no fever chills or sputum production. He's had some right-sided chest discomfort the presents him from sleeping well   Review of Systems Review of systems otherwise negative    Objective:   Physical Exam  Well-developed well-nourished male no acute distress vital signs stable he is afebrile HEENT were negative neck was supple no adenopathy lungs are clear.      Assessment & Plan:  Viral syndrome with cough and some chest wall pain........ treat symptomatically with Hydromet

## 2014-06-14 DIAGNOSIS — E875 Hyperkalemia: Secondary | ICD-10-CM | POA: Diagnosis not present

## 2014-06-14 DIAGNOSIS — F329 Major depressive disorder, single episode, unspecified: Secondary | ICD-10-CM | POA: Diagnosis not present

## 2014-06-14 DIAGNOSIS — M792 Neuralgia and neuritis, unspecified: Secondary | ICD-10-CM | POA: Diagnosis not present

## 2014-06-14 DIAGNOSIS — I10 Essential (primary) hypertension: Secondary | ICD-10-CM | POA: Diagnosis not present

## 2014-07-16 ENCOUNTER — Ambulatory Visit (INDEPENDENT_AMBULATORY_CARE_PROVIDER_SITE_OTHER): Payer: Medicare Other | Admitting: Family Medicine

## 2014-07-16 ENCOUNTER — Encounter: Payer: Self-pay | Admitting: Family Medicine

## 2014-07-16 VITALS — BP 130/90 | Temp 97.9°F | Ht 70.0 in | Wt 203.0 lb

## 2014-07-16 DIAGNOSIS — K219 Gastro-esophageal reflux disease without esophagitis: Secondary | ICD-10-CM

## 2014-07-16 DIAGNOSIS — G609 Hereditary and idiopathic neuropathy, unspecified: Secondary | ICD-10-CM

## 2014-07-16 DIAGNOSIS — R351 Nocturia: Secondary | ICD-10-CM

## 2014-07-16 DIAGNOSIS — F32A Depression, unspecified: Secondary | ICD-10-CM

## 2014-07-16 DIAGNOSIS — I10 Essential (primary) hypertension: Secondary | ICD-10-CM | POA: Diagnosis not present

## 2014-07-16 DIAGNOSIS — N401 Enlarged prostate with lower urinary tract symptoms: Secondary | ICD-10-CM | POA: Diagnosis not present

## 2014-07-16 DIAGNOSIS — G4700 Insomnia, unspecified: Secondary | ICD-10-CM

## 2014-07-16 DIAGNOSIS — E782 Mixed hyperlipidemia: Secondary | ICD-10-CM | POA: Diagnosis not present

## 2014-07-16 DIAGNOSIS — F329 Major depressive disorder, single episode, unspecified: Secondary | ICD-10-CM

## 2014-07-16 LAB — CBC WITH DIFFERENTIAL/PLATELET
BASOS PCT: 0.3 % (ref 0.0–3.0)
Basophils Absolute: 0 10*3/uL (ref 0.0–0.1)
EOS PCT: 2.2 % (ref 0.0–5.0)
Eosinophils Absolute: 0.1 10*3/uL (ref 0.0–0.7)
HCT: 44.5 % (ref 39.0–52.0)
Hemoglobin: 15 g/dL (ref 13.0–17.0)
LYMPHS ABS: 2 10*3/uL (ref 0.7–4.0)
LYMPHS PCT: 30.5 % (ref 12.0–46.0)
MCHC: 33.7 g/dL (ref 30.0–36.0)
MCV: 89.2 fl (ref 78.0–100.0)
MONOS PCT: 5.9 % (ref 3.0–12.0)
Monocytes Absolute: 0.4 10*3/uL (ref 0.1–1.0)
NEUTROS ABS: 4 10*3/uL (ref 1.4–7.7)
Neutrophils Relative %: 61.1 % (ref 43.0–77.0)
Platelets: 178 10*3/uL (ref 150.0–400.0)
RBC: 4.99 Mil/uL (ref 4.22–5.81)
RDW: 14.1 % (ref 11.5–15.5)
WBC: 6.6 10*3/uL (ref 4.0–10.5)

## 2014-07-16 LAB — LIPID PANEL
CHOL/HDL RATIO: 4
Cholesterol: 183 mg/dL (ref 0–200)
HDL: 51.4 mg/dL (ref >39.00)
LDL Cholesterol: 111 mg/dL — ABNORMAL HIGH (ref 0–99)
NonHDL: 131.6
Triglycerides: 103 mg/dL (ref 0.0–149.0)
VLDL: 20.6 mg/dL (ref 0.0–40.0)

## 2014-07-16 LAB — PSA: PSA: 1.34 ng/mL (ref 0.10–4.00)

## 2014-07-16 LAB — POCT URINALYSIS DIPSTICK
Blood, UA: NEGATIVE
GLUCOSE UA: NEGATIVE
KETONES UA: NEGATIVE
LEUKOCYTES UA: NEGATIVE
Nitrite, UA: NEGATIVE
PH UA: 5.5
Protein, UA: NEGATIVE
Spec Grav, UA: 1.02
Urobilinogen, UA: 0.2

## 2014-07-16 LAB — BASIC METABOLIC PANEL
BUN: 15 mg/dL (ref 6–23)
CALCIUM: 9.4 mg/dL (ref 8.4–10.5)
CO2: 32 meq/L (ref 19–32)
Chloride: 107 mEq/L (ref 96–112)
Creatinine, Ser: 1.09 mg/dL (ref 0.40–1.50)
GFR: 70.93 mL/min (ref >60.00)
GLUCOSE: 88 mg/dL (ref 70–99)
Potassium: 4.8 mEq/L (ref 3.5–5.1)
SODIUM: 143 meq/L (ref 135–145)

## 2014-07-16 LAB — HEPATIC FUNCTION PANEL
ALT: 23 U/L (ref 0–53)
AST: 19 U/L (ref 0–37)
Albumin: 4 g/dL (ref 3.5–5.2)
Alkaline Phosphatase: 79 U/L (ref 39–117)
BILIRUBIN TOTAL: 0.7 mg/dL (ref 0.2–1.2)
Bilirubin, Direct: 0.2 mg/dL (ref 0.0–0.3)
Total Protein: 7 g/dL (ref 6.0–8.3)

## 2014-07-16 LAB — TSH: TSH: 2.12 u[IU]/mL (ref 0.35–4.50)

## 2014-07-16 MED ORDER — GABAPENTIN 800 MG PO TABS: 800.0000 mg | ORAL_TABLET | Freq: Two times a day (BID) | ORAL | Status: DC

## 2014-07-16 MED ORDER — LISINOPRIL 10 MG PO TABS: 10.0000 mg | ORAL_TABLET | Freq: Every day | ORAL | Status: DC

## 2014-07-16 MED ORDER — OMEPRAZOLE 40 MG PO CPDR
40.0000 mg | DELAYED_RELEASE_CAPSULE | Freq: Every day | ORAL | Status: AC
Start: 2014-07-16 — End: ?

## 2014-07-16 MED ORDER — SIMVASTATIN 20 MG PO TABS
20.0000 mg | ORAL_TABLET | Freq: Every day | ORAL | Status: DC
Start: 2014-07-16 — End: 2022-03-18

## 2014-07-16 MED ORDER — CITALOPRAM HYDROBROMIDE 20 MG PO TABS: 20.0000 mg | ORAL_TABLET | Freq: Every day | ORAL | Status: DC

## 2014-07-16 NOTE — Patient Instructions (Signed)
Continue current medications  Labs today  Remember to get outside and walk 30 minutes daily  Return in one year sooner if any problems  Healthcare power of attorney and living well

## 2014-07-16 NOTE — Progress Notes (Signed)
Pre visit review using our clinic review tool, if applicable. No additional management support is needed unless otherwise documented below in the visit note. 

## 2014-07-16 NOTE — Progress Notes (Signed)
   Subjective:    Patient ID: Kurt Parsons, male    DOB: 1946/03/10, 69 y.o.   MRN: [id number]  HPI Kurt Parsons is a 69 year old male married nonsmoker who comes in today for general physical examination because of a history of mild depression, sleep dysfunction, hypertension, reflux esophagitis, and hyperlipidemia  His med list reviewed its correct  He gets routine eye care, dental care, recent colonoscopy normal, vaccinations up-to-date  Cognitive function normal he exercises daily with his grandchildren. He spends 3-4 days a week with them in [place], home health safety reviewed no issues identified, no guns in the house, he does have a healthcare power of attorney and living well   Review of Systems  Constitutional: Negative.   HENT: Negative.   Eyes: Negative.   Respiratory: Negative.   Cardiovascular: Negative.   Gastrointestinal: Negative.   Endocrine: Negative.   Genitourinary: Negative.   Musculoskeletal: Negative.   Skin: Negative.   Allergic/Immunologic: Negative.   Neurological: Negative.   Hematological: Negative.   Psychiatric/Behavioral: Negative.        Objective:   Physical Exam  Constitutional: He is oriented to person, place, and time. He appears well-developed and well-nourished.  HENT:  Head: Normocephalic and atraumatic.  Right Ear: External ear normal.  Left Ear: External ear normal.  Nose: Nose normal.  Mouth/Throat: Oropharynx is clear and moist.  Eyes: Conjunctivae and EOM are normal. Pupils are equal, round, and reactive to light.  Neck: Normal range of motion. Neck supple. No JVD present. No tracheal deviation present. No thyromegaly present.  Cardiovascular: Normal rate, regular rhythm, normal heart sounds and intact distal pulses.  Exam reveals no gallop and no friction rub.   No murmur heard. No carotid nor aortic bruits peripheral pulses 2+ and symmetrical  Pulmonary/Chest: Effort normal and breath sounds normal. No stridor. No respiratory  distress. He has no wheezes. He has no rales. He exhibits no tenderness.  Abdominal: Soft. Bowel sounds are normal. He exhibits no distension and no mass. There is no tenderness. There is no rebound and no guarding.  Genitourinary: Rectum normal and penis normal. Guaiac negative stool. No penile tenderness.  1+ symmetrical nonnodular BPH  Musculoskeletal: Normal range of motion. He exhibits no edema or tenderness.  Lymphadenopathy:    He has no cervical adenopathy.  Neurological: He is alert and oriented to person, place, and time. He has normal reflexes. No cranial nerve deficit. He exhibits normal muscle tone.  Skin: Skin is warm and dry. No rash noted. No erythema. No pallor.  Total body skin exam normal scars from previous neck and disc surgery  Psychiatric: He has a normal mood and affect. His behavior is normal. Judgment and thought content normal.  Nursing note and vitals reviewed.         Assessment & Plan:  Healthy male  History of mild depression continue Celexa and Ativan  Neuropathy etiology unknown continue Neurontin  Hypertension continue lisinopril  Reflux esophagitis continue Prilosec  Hyperlipidemia continue Zocor

## 2014-07-19 DIAGNOSIS — Z1211 Encounter for screening for malignant neoplasm of colon: Secondary | ICD-10-CM | POA: Diagnosis not present

## 2014-07-26 DIAGNOSIS — H16103 Unspecified superficial keratitis, bilateral: Secondary | ICD-10-CM | POA: Diagnosis not present

## 2014-07-26 DIAGNOSIS — H04123 Dry eye syndrome of bilateral lacrimal glands: Secondary | ICD-10-CM | POA: Diagnosis not present

## 2014-07-26 DIAGNOSIS — D3132 Benign neoplasm of left choroid: Secondary | ICD-10-CM | POA: Diagnosis not present

## 2014-07-26 DIAGNOSIS — H1789 Other corneal scars and opacities: Secondary | ICD-10-CM | POA: Diagnosis not present

## 2014-11-27 ENCOUNTER — Encounter: Payer: Self-pay | Admitting: Family Medicine

## 2014-11-27 ENCOUNTER — Ambulatory Visit (INDEPENDENT_AMBULATORY_CARE_PROVIDER_SITE_OTHER): Payer: Medicare Other | Admitting: Family Medicine

## 2014-11-27 VITALS — BP 138/90 | HR 68 | Temp 98.2°F | Ht 70.0 in | Wt 203.9 lb

## 2014-11-27 DIAGNOSIS — R05 Cough: Secondary | ICD-10-CM | POA: Diagnosis not present

## 2014-11-27 DIAGNOSIS — R059 Cough, unspecified: Secondary | ICD-10-CM

## 2014-11-27 DIAGNOSIS — J329 Chronic sinusitis, unspecified: Secondary | ICD-10-CM | POA: Diagnosis not present

## 2014-11-27 DIAGNOSIS — J310 Chronic rhinitis: Secondary | ICD-10-CM

## 2014-11-27 MED ORDER — HYDROCODONE-HOMATROPINE 5-1.5 MG/5ML PO SYRP: 5.0000 mL | ORAL_SOLUTION | Freq: Three times a day (TID) | ORAL | Status: DC | PRN

## 2014-11-27 NOTE — Progress Notes (Signed)
HPI:  Cough: -started: gets every year at the same time, started 2 weeks ago, does have allergies with itchy eyes - reports Doctor todd gives him hycodan for the night -symptoms:nasal congestion, sore throat, cough, PND -denies:fever, SOB, NVD, tooth pain -has tried:  -sick contacts/travel/risks: denies flu exposure, tick exposure or or Ebola risks -Hx of: allergies ROS: See pertinent positives and negatives per HPI.  Past Medical History  Diagnosis Date  . Lazy eye rt eye  . Hyperlipidemia     takes Zocor nightly  . Sleep disturbance     takes Ambien prn  . Chronic pain syndrome     takes Morphine daily  . Inguinal hernia   . Liver cyst   . Right hydrocele   . Gallbladder sludge   . Internal hemorrhoids   . ED (erectile dysfunction)     takes Celexa daily  . Constipation     takes Colace daily  . Hypertension     takes Lisinopril daily  . GERD (gastroesophageal reflux disease)     takes Omeprazole daily  . Dizziness     occasionally  . Back pain   . Neck pain   . Neuropathy   . Ulcer   . Glaucoma     mild per pt    Past Surgical History  Procedure Laterality Date  . Cervical disc surgery      x 2  . Lumbar disc surgery      x 2  . Colonoscopy    . Neck surgery    . Hemorrhoid surgery    . Back surgery    . Bilateral cataract removed    . Inguinal hernia repair Right 03/21/2013    Procedure: HERNIA REPAIR INGUINAL ADULT;  Surgeon: Imogene Burn. Georgette Dover, MD;  Location: [facility];  Service: General;  Laterality: Right;  . Insertion of mesh Right 03/21/2013    Procedure: INSERTION OF MESH;  Surgeon: Imogene Burn. Georgette Dover, MD;  Location: [facility];  Service: General;  Laterality: Right;    Family History  Problem Relation Age of Onset  . Diabetes    . Hypertension    . Stroke    . Heart disease    . COPD      History   Social History  . Marital Status: Married    Spouse Name: N/A  . Number of Children: 1  . Years of Education: N/A   Occupational History  .  retired    Social History Main Topics  . Smoking status: Former Research scientist (life sciences)  . Smokeless tobacco: Never Used     Comment: quit in 1976  . Alcohol Use: No     Comment: quit in 1989  . Drug Use: No  . Sexual Activity: Yes   Other Topics Concern  . None   Social History Narrative     Current outpatient prescriptions:  .  aspirin EC 81 MG tablet, Take 1 tablet (81 mg total) by mouth daily., Disp: , Rfl:  .  citalopram (CELEXA) 20 MG tablet, Take 1 tablet (20 mg total) by mouth daily., Disp: 100 tablet, Rfl: 3 .  Docusate Sodium (COLACE PO), Take 1 capsule by mouth daily as needed (for constipation). , Disp: , Rfl:  .  gabapentin (NEURONTIN) 800 MG tablet, Take 1 tablet (800 mg total) by mouth 2 (two) times daily., Disp: 200 tablet, Rfl: 3 .  HYDROcodone-homatropine (HYCODAN) 5-1.5 MG/5ML syrup, Take 5 mLs by mouth every 8 (eight) hours as needed for cough., Disp: 120  mL, Rfl: 0 .  lisinopril (PRINIVIL,ZESTRIL) 10 MG tablet, Take 1 tablet (10 mg total) by mouth daily., Disp: 90 tablet, Rfl: 3 .  LORazepam (ATIVAN) 0.5 MG tablet, Half tab at bedtime as needed., Disp: 60 tablet, Rfl: 3 .  omeprazole (PRILOSEC) 40 MG capsule, Take 1 capsule (40 mg total) by mouth daily., Disp: 100 capsule, Rfl: 3 .  simvastatin (ZOCOR) 20 MG tablet, Take 1 tablet (20 mg total) by mouth at bedtime., Disp: 100 tablet, Rfl: 3  EXAM:  Filed Vitals:   11/27/14 0917  BP: 138/90  Pulse: 68  Temp: 98.2 F (36.8 C)    Body mass index is 29.26 kg/(m^2).  GENERAL: vitals reviewed and listed above, alert, oriented, appears well hydrated and in no acute distress  HEENT: atraumatic, conjunttiva clear, no obvious abnormalities on inspection of external nose and ears, normal appearance of ear canals and TMs, clear nasal congestion, mild post oropharyngeal erythema with PND, no tonsillar edema or exudate, no sinus TTP  NECK: no obvious masses on inspection  LUNGS: clear to auscultation bilaterally, no wheezes, rales  or rhonchi, good air movement  CV: HRRR, no peripheral edema  MS: moves all extremities without noticeable abnormality  PSYCH: pleasant and cooperative, no obvious depression or anxiety  ASSESSMENT AND PLAN:  Discussed the following assessment and plan:  Cough - Plan: HYDROcodone-homatropine (HYCODAN) 5-1.5 MG/5ML syrup  Rhinosinusitis  -given HPI and exam findings today, a serious infection or illness is unlikely. We discussed potential etiologies, with allergic or viral rhinosinusitis with PND being most likely, and advised supportive care and monitoring. We discussed treatment side effects, likely course, antibiotic misuse, transmission, and signs of developing a serious illness. -of course, we advised to return or notify a doctor immediately if symptoms worsen or persist or new concerns arise.    Patient Instructions  Start Claritin daily for 1-2 months and yearly during this time of the year  Flonase 2 sprays each nostril daily for for 1 month, then 1 spray daily  Use the cough medication as needed at night  Follow up if worsening or not improving over the next few weeks      KIM, HANNAH R.

## 2014-11-27 NOTE — Patient Instructions (Signed)
Start Claritin daily for 1-2 months and yearly during this time of the year  Flonase 2 sprays each nostril daily for for 1 month, then 1 spray daily  Use the cough medication as needed at night  Follow up if worsening or not improving over the next few weeks

## 2014-11-27 NOTE — Progress Notes (Signed)
Pre visit review using our clinic review tool, if applicable. No additional management support is needed unless otherwise documented below in the visit note. 

## 2014-12-25 DIAGNOSIS — I159 Secondary hypertension, unspecified: Secondary | ICD-10-CM | POA: Diagnosis not present

## 2014-12-25 DIAGNOSIS — Z23 Encounter for immunization: Secondary | ICD-10-CM | POA: Diagnosis not present

## 2014-12-25 DIAGNOSIS — Z136 Encounter for screening for cardiovascular disorders: Secondary | ICD-10-CM | POA: Diagnosis not present

## 2014-12-25 DIAGNOSIS — D649 Anemia, unspecified: Secondary | ICD-10-CM | POA: Diagnosis not present

## 2014-12-31 DIAGNOSIS — Z136 Encounter for screening for cardiovascular disorders: Secondary | ICD-10-CM | POA: Diagnosis not present

## 2014-12-31 DIAGNOSIS — E785 Hyperlipidemia, unspecified: Secondary | ICD-10-CM | POA: Diagnosis not present

## 2014-12-31 DIAGNOSIS — I10 Essential (primary) hypertension: Secondary | ICD-10-CM | POA: Diagnosis not present

## 2015-01-02 DIAGNOSIS — M7541 Impingement syndrome of right shoulder: Secondary | ICD-10-CM | POA: Diagnosis not present

## 2015-01-02 DIAGNOSIS — S4991XA Unspecified injury of right shoulder and upper arm, initial encounter: Secondary | ICD-10-CM | POA: Diagnosis not present

## 2015-01-02 DIAGNOSIS — M7501 Adhesive capsulitis of right shoulder: Secondary | ICD-10-CM | POA: Diagnosis not present

## 2015-01-09 DIAGNOSIS — M7541 Impingement syndrome of right shoulder: Secondary | ICD-10-CM | POA: Diagnosis not present

## 2015-01-13 DIAGNOSIS — M7541 Impingement syndrome of right shoulder: Secondary | ICD-10-CM | POA: Diagnosis not present

## 2015-07-24 ENCOUNTER — Other Ambulatory Visit: Payer: Self-pay | Admitting: Family Medicine

## 2015-08-13 ENCOUNTER — Telehealth: Payer: Self-pay | Admitting: Family Medicine

## 2015-08-13 DIAGNOSIS — H1789 Other corneal scars and opacities: Secondary | ICD-10-CM | POA: Diagnosis not present

## 2015-08-13 DIAGNOSIS — H04123 Dry eye syndrome of bilateral lacrimal glands: Secondary | ICD-10-CM | POA: Diagnosis not present

## 2015-08-13 DIAGNOSIS — H3500 Unspecified background retinopathy: Secondary | ICD-10-CM | POA: Diagnosis not present

## 2015-08-13 DIAGNOSIS — H16102 Unspecified superficial keratitis, left eye: Secondary | ICD-10-CM | POA: Diagnosis not present

## 2015-08-13 NOTE — Telephone Encounter (Signed)
Disregard previous phone note

## 2015-09-07 ENCOUNTER — Other Ambulatory Visit: Payer: Self-pay | Admitting: Family Medicine

## 2015-09-30 ENCOUNTER — Encounter: Payer: Self-pay | Admitting: Internal Medicine

## 2015-11-07 DIAGNOSIS — Z79899 Other long term (current) drug therapy: Secondary | ICD-10-CM | POA: Diagnosis not present

## 2015-11-07 DIAGNOSIS — Z7982 Long term (current) use of aspirin: Secondary | ICD-10-CM | POA: Diagnosis not present

## 2015-11-07 DIAGNOSIS — E785 Hyperlipidemia, unspecified: Secondary | ICD-10-CM | POA: Diagnosis not present

## 2015-11-07 DIAGNOSIS — M542 Cervicalgia: Secondary | ICD-10-CM | POA: Diagnosis not present

## 2015-11-07 DIAGNOSIS — Z4789 Encounter for other orthopedic aftercare: Secondary | ICD-10-CM | POA: Diagnosis not present

## 2015-11-07 DIAGNOSIS — R200 Anesthesia of skin: Secondary | ICD-10-CM | POA: Diagnosis not present

## 2015-11-07 DIAGNOSIS — I10 Essential (primary) hypertension: Secondary | ICD-10-CM | POA: Diagnosis not present

## 2015-11-07 DIAGNOSIS — Z981 Arthrodesis status: Secondary | ICD-10-CM | POA: Diagnosis not present

## 2015-11-07 DIAGNOSIS — R208 Other disturbances of skin sensation: Secondary | ICD-10-CM | POA: Diagnosis not present

## 2015-11-07 DIAGNOSIS — M792 Neuralgia and neuritis, unspecified: Secondary | ICD-10-CM | POA: Diagnosis not present

## 2015-11-07 DIAGNOSIS — M4802 Spinal stenosis, cervical region: Secondary | ICD-10-CM | POA: Diagnosis not present

## 2015-11-07 DIAGNOSIS — M79601 Pain in right arm: Secondary | ICD-10-CM | POA: Diagnosis not present

## 2015-11-11 DIAGNOSIS — E785 Hyperlipidemia, unspecified: Secondary | ICD-10-CM | POA: Diagnosis not present

## 2015-11-11 DIAGNOSIS — Z1211 Encounter for screening for malignant neoplasm of colon: Secondary | ICD-10-CM | POA: Diagnosis not present

## 2015-11-11 DIAGNOSIS — Z0000 Encounter for general adult medical examination without abnormal findings: Secondary | ICD-10-CM | POA: Diagnosis not present

## 2015-11-11 DIAGNOSIS — Z1159 Encounter for screening for other viral diseases: Secondary | ICD-10-CM | POA: Diagnosis not present

## 2015-11-11 DIAGNOSIS — I10 Essential (primary) hypertension: Secondary | ICD-10-CM | POA: Diagnosis not present

## 2015-11-13 DIAGNOSIS — M5412 Radiculopathy, cervical region: Secondary | ICD-10-CM | POA: Diagnosis not present

## 2015-11-13 DIAGNOSIS — Z981 Arthrodesis status: Secondary | ICD-10-CM | POA: Diagnosis not present

## 2015-11-13 DIAGNOSIS — M4722 Other spondylosis with radiculopathy, cervical region: Secondary | ICD-10-CM | POA: Diagnosis not present

## 2015-11-13 DIAGNOSIS — M9971 Connective tissue and disc stenosis of intervertebral foramina of cervical region: Secondary | ICD-10-CM | POA: Diagnosis not present

## 2015-11-13 DIAGNOSIS — M5011 Cervical disc disorder with radiculopathy,  high cervical region: Secondary | ICD-10-CM | POA: Diagnosis not present

## 2015-12-03 DIAGNOSIS — Z1211 Encounter for screening for malignant neoplasm of colon: Secondary | ICD-10-CM | POA: Diagnosis not present

## 2015-12-08 DIAGNOSIS — M47812 Spondylosis without myelopathy or radiculopathy, cervical region: Secondary | ICD-10-CM | POA: Diagnosis not present

## 2016-01-21 DIAGNOSIS — H9113 Presbycusis, bilateral: Secondary | ICD-10-CM | POA: Diagnosis not present

## 2016-01-21 DIAGNOSIS — H903 Sensorineural hearing loss, bilateral: Secondary | ICD-10-CM | POA: Diagnosis not present

## 2016-02-13 DIAGNOSIS — H04123 Dry eye syndrome of bilateral lacrimal glands: Secondary | ICD-10-CM | POA: Diagnosis not present

## 2016-02-13 DIAGNOSIS — Z961 Presence of intraocular lens: Secondary | ICD-10-CM | POA: Diagnosis not present

## 2016-02-13 DIAGNOSIS — H53001 Unspecified amblyopia, right eye: Secondary | ICD-10-CM | POA: Diagnosis not present

## 2016-02-13 DIAGNOSIS — D3132 Benign neoplasm of left choroid: Secondary | ICD-10-CM | POA: Diagnosis not present

## 2016-03-09 ENCOUNTER — Other Ambulatory Visit: Payer: Medicare Other

## 2016-03-15 ENCOUNTER — Encounter: Payer: Medicare Other | Admitting: Family Medicine

## 2016-05-12 DIAGNOSIS — I10 Essential (primary) hypertension: Secondary | ICD-10-CM | POA: Diagnosis not present

## 2016-05-12 DIAGNOSIS — M792 Neuralgia and neuritis, unspecified: Secondary | ICD-10-CM | POA: Diagnosis not present

## 2016-05-12 DIAGNOSIS — E785 Hyperlipidemia, unspecified: Secondary | ICD-10-CM | POA: Diagnosis not present

## 2016-05-12 DIAGNOSIS — M25511 Pain in right shoulder: Secondary | ICD-10-CM | POA: Diagnosis not present

## 2016-06-16 DIAGNOSIS — I10 Essential (primary) hypertension: Secondary | ICD-10-CM | POA: Diagnosis not present

## 2016-06-16 DIAGNOSIS — R0683 Snoring: Secondary | ICD-10-CM | POA: Diagnosis not present

## 2016-08-14 ENCOUNTER — Other Ambulatory Visit: Payer: Self-pay | Admitting: Family Medicine

## 2016-08-16 NOTE — Telephone Encounter (Signed)
Denied.  Not seen since 2016.  Message sent to the pharmacy.

## 2016-10-22 ENCOUNTER — Other Ambulatory Visit: Payer: Self-pay | Admitting: Family Medicine

## 2016-11-07 ENCOUNTER — Other Ambulatory Visit: Payer: Self-pay | Admitting: Family Medicine

## 2016-11-08 NOTE — Telephone Encounter (Signed)
Denied.  No longer a patient of Dr. Sherren Mocha.  Now seen at [facility].

## 2016-12-14 DIAGNOSIS — Z1211 Encounter for screening for malignant neoplasm of colon: Secondary | ICD-10-CM | POA: Diagnosis not present

## 2016-12-14 DIAGNOSIS — I10 Essential (primary) hypertension: Secondary | ICD-10-CM | POA: Diagnosis not present

## 2016-12-14 DIAGNOSIS — M25511 Pain in right shoulder: Secondary | ICD-10-CM | POA: Diagnosis not present

## 2016-12-14 DIAGNOSIS — M792 Neuralgia and neuritis, unspecified: Secondary | ICD-10-CM | POA: Diagnosis not present

## 2016-12-28 DIAGNOSIS — Z1211 Encounter for screening for malignant neoplasm of colon: Secondary | ICD-10-CM | POA: Diagnosis not present

## 2017-01-20 DIAGNOSIS — I10 Essential (primary) hypertension: Secondary | ICD-10-CM | POA: Diagnosis not present

## 2017-01-20 DIAGNOSIS — R399 Unspecified symptoms and signs involving the genitourinary system: Secondary | ICD-10-CM | POA: Diagnosis not present

## 2017-02-14 DIAGNOSIS — H43813 Vitreous degeneration, bilateral: Secondary | ICD-10-CM | POA: Diagnosis not present

## 2017-02-14 DIAGNOSIS — H5201 Hypermetropia, right eye: Secondary | ICD-10-CM | POA: Diagnosis not present

## 2017-02-14 DIAGNOSIS — D3132 Benign neoplasm of left choroid: Secondary | ICD-10-CM | POA: Diagnosis not present

## 2017-02-14 DIAGNOSIS — H26493 Other secondary cataract, bilateral: Secondary | ICD-10-CM | POA: Diagnosis not present

## 2017-02-24 DIAGNOSIS — H26491 Other secondary cataract, right eye: Secondary | ICD-10-CM | POA: Diagnosis not present

## 2017-07-25 DIAGNOSIS — I10 Essential (primary) hypertension: Secondary | ICD-10-CM | POA: Diagnosis not present

## 2017-07-25 DIAGNOSIS — R220 Localized swelling, mass and lump, head: Secondary | ICD-10-CM | POA: Diagnosis not present

## 2017-07-25 DIAGNOSIS — M792 Neuralgia and neuritis, unspecified: Secondary | ICD-10-CM | POA: Diagnosis not present

## 2017-08-26 DIAGNOSIS — G4700 Insomnia, unspecified: Secondary | ICD-10-CM | POA: Diagnosis not present

## 2017-08-26 DIAGNOSIS — E785 Hyperlipidemia, unspecified: Secondary | ICD-10-CM | POA: Diagnosis not present

## 2017-08-26 DIAGNOSIS — R0609 Other forms of dyspnea: Secondary | ICD-10-CM | POA: Diagnosis not present

## 2017-08-26 DIAGNOSIS — I4510 Unspecified right bundle-branch block: Secondary | ICD-10-CM | POA: Diagnosis not present

## 2017-08-26 DIAGNOSIS — I10 Essential (primary) hypertension: Secondary | ICD-10-CM | POA: Diagnosis not present

## 2017-08-26 DIAGNOSIS — R001 Bradycardia, unspecified: Secondary | ICD-10-CM | POA: Diagnosis not present

## 2018-01-20 ENCOUNTER — Other Ambulatory Visit: Payer: Self-pay

## 2018-01-20 NOTE — Patient Outreach (Signed)
[facility]) Care Management  01/20/2018  ELISHA COOKSEY 1945-07-04 [id number]   Medication Adherence call to Mr. Deatra Canter Gonet left a message for patient to call back patient is due on Lisinopril / Hctz 20/12.5 mg. Mr. Snelling is showing past due under [employer].   [facility] Direct Dial [phone]  Fax [phone] [email or web address]

## 2018-04-26 DIAGNOSIS — R0600 Dyspnea, unspecified: Secondary | ICD-10-CM | POA: Diagnosis not present

## 2018-04-26 DIAGNOSIS — I10 Essential (primary) hypertension: Secondary | ICD-10-CM | POA: Diagnosis not present

## 2018-04-26 DIAGNOSIS — R221 Localized swelling, mass and lump, neck: Secondary | ICD-10-CM | POA: Diagnosis not present

## 2018-04-26 DIAGNOSIS — Z23 Encounter for immunization: Secondary | ICD-10-CM | POA: Diagnosis not present

## 2018-04-29 DIAGNOSIS — Z1212 Encounter for screening for malignant neoplasm of rectum: Secondary | ICD-10-CM | POA: Diagnosis not present

## 2018-04-29 DIAGNOSIS — Z1211 Encounter for screening for malignant neoplasm of colon: Secondary | ICD-10-CM | POA: Diagnosis not present

## 2018-05-25 DIAGNOSIS — R221 Localized swelling, mass and lump, neck: Secondary | ICD-10-CM | POA: Diagnosis not present

## 2018-06-09 DIAGNOSIS — R221 Localized swelling, mass and lump, neck: Secondary | ICD-10-CM | POA: Diagnosis not present

## 2018-06-09 DIAGNOSIS — S42022D Displaced fracture of shaft of left clavicle, subsequent encounter for fracture with routine healing: Secondary | ICD-10-CM | POA: Diagnosis not present

## 2018-06-09 DIAGNOSIS — R911 Solitary pulmonary nodule: Secondary | ICD-10-CM | POA: Diagnosis not present

## 2018-07-05 DIAGNOSIS — R221 Localized swelling, mass and lump, neck: Secondary | ICD-10-CM | POA: Diagnosis not present

## 2018-07-12 DIAGNOSIS — K219 Gastro-esophageal reflux disease without esophagitis: Secondary | ICD-10-CM | POA: Diagnosis not present

## 2018-07-12 DIAGNOSIS — E785 Hyperlipidemia, unspecified: Secondary | ICD-10-CM | POA: Diagnosis not present

## 2018-07-12 DIAGNOSIS — M1990 Unspecified osteoarthritis, unspecified site: Secondary | ICD-10-CM | POA: Diagnosis not present

## 2018-07-12 DIAGNOSIS — I10 Essential (primary) hypertension: Secondary | ICD-10-CM | POA: Diagnosis not present

## 2018-07-12 DIAGNOSIS — K635 Polyp of colon: Secondary | ICD-10-CM | POA: Diagnosis not present

## 2018-07-12 DIAGNOSIS — Z1211 Encounter for screening for malignant neoplasm of colon: Secondary | ICD-10-CM | POA: Diagnosis not present

## 2018-07-12 DIAGNOSIS — K648 Other hemorrhoids: Secondary | ICD-10-CM | POA: Diagnosis not present

## 2018-07-12 DIAGNOSIS — K5730 Diverticulosis of large intestine without perforation or abscess without bleeding: Secondary | ICD-10-CM | POA: Diagnosis not present

## 2018-07-21 DIAGNOSIS — R221 Localized swelling, mass and lump, neck: Secondary | ICD-10-CM | POA: Diagnosis not present

## 2018-10-09 DIAGNOSIS — I10 Essential (primary) hypertension: Secondary | ICD-10-CM | POA: Diagnosis not present

## 2018-10-09 DIAGNOSIS — R911 Solitary pulmonary nodule: Secondary | ICD-10-CM | POA: Diagnosis not present

## 2018-10-09 DIAGNOSIS — M792 Neuralgia and neuritis, unspecified: Secondary | ICD-10-CM | POA: Diagnosis not present

## 2018-11-13 DIAGNOSIS — R918 Other nonspecific abnormal finding of lung field: Secondary | ICD-10-CM | POA: Diagnosis not present

## 2018-11-13 DIAGNOSIS — R911 Solitary pulmonary nodule: Secondary | ICD-10-CM | POA: Diagnosis not present

## 2018-11-13 DIAGNOSIS — J984 Other disorders of lung: Secondary | ICD-10-CM | POA: Diagnosis not present

## 2018-12-19 DIAGNOSIS — H26492 Other secondary cataract, left eye: Secondary | ICD-10-CM | POA: Diagnosis not present

## 2018-12-19 DIAGNOSIS — H52203 Unspecified astigmatism, bilateral: Secondary | ICD-10-CM | POA: Diagnosis not present

## 2022-03-15 ENCOUNTER — Emergency Department (HOSPITAL_COMMUNITY): Payer: Medicare Other

## 2022-03-15 ENCOUNTER — Inpatient Hospital Stay (HOSPITAL_COMMUNITY)
Admission: EM | Admit: 2022-03-15 | Discharge: 2022-03-18 | DRG: 041 | Disposition: A | Discharge status: Home Health | Payer: Medicare Other | Attending: Neurology | Admitting: Neurology

## 2022-03-15 DIAGNOSIS — I639 Cerebral infarction, unspecified: Secondary | ICD-10-CM | POA: Diagnosis present

## 2022-03-15 DIAGNOSIS — R29810 Facial weakness: Secondary | ICD-10-CM | POA: Diagnosis present

## 2022-03-15 DIAGNOSIS — Z8249 Family history of ischemic heart disease and other diseases of the circulatory system: Secondary | ICD-10-CM | POA: Diagnosis not present

## 2022-03-15 DIAGNOSIS — Z823 Family history of stroke: Secondary | ICD-10-CM

## 2022-03-15 DIAGNOSIS — Z7982 Long term (current) use of aspirin: Secondary | ICD-10-CM | POA: Diagnosis not present

## 2022-03-15 DIAGNOSIS — K219 Gastro-esophageal reflux disease without esophagitis: Secondary | ICD-10-CM | POA: Diagnosis present

## 2022-03-15 DIAGNOSIS — Z79899 Other long term (current) drug therapy: Secondary | ICD-10-CM

## 2022-03-15 DIAGNOSIS — E785 Hyperlipidemia, unspecified: Secondary | ICD-10-CM | POA: Diagnosis present

## 2022-03-15 DIAGNOSIS — I959 Hypotension, unspecified: Secondary | ICD-10-CM | POA: Diagnosis present

## 2022-03-15 DIAGNOSIS — E739 Lactose intolerance, unspecified: Secondary | ICD-10-CM | POA: Diagnosis present

## 2022-03-15 DIAGNOSIS — R29719 NIHSS score 19: Secondary | ICD-10-CM | POA: Diagnosis present

## 2022-03-15 DIAGNOSIS — I63412 Cerebral infarction due to embolism of left middle cerebral artery: Secondary | ICD-10-CM | POA: Diagnosis present

## 2022-03-15 DIAGNOSIS — G8191 Hemiplegia, unspecified affecting right dominant side: Secondary | ICD-10-CM | POA: Diagnosis present

## 2022-03-15 DIAGNOSIS — I10 Essential (primary) hypertension: Secondary | ICD-10-CM | POA: Diagnosis present

## 2022-03-15 DIAGNOSIS — Z87891 Personal history of nicotine dependence: Secondary | ICD-10-CM

## 2022-03-15 DIAGNOSIS — D696 Thrombocytopenia, unspecified: Secondary | ICD-10-CM | POA: Diagnosis present

## 2022-03-15 DIAGNOSIS — Z85118 Personal history of other malignant neoplasm of bronchus and lung: Secondary | ICD-10-CM

## 2022-03-15 DIAGNOSIS — E669 Obesity, unspecified: Secondary | ICD-10-CM | POA: Diagnosis present

## 2022-03-15 DIAGNOSIS — Z833 Family history of diabetes mellitus: Secondary | ICD-10-CM

## 2022-03-15 DIAGNOSIS — G894 Chronic pain syndrome: Secondary | ICD-10-CM | POA: Diagnosis present

## 2022-03-15 DIAGNOSIS — N179 Acute kidney failure, unspecified: Secondary | ICD-10-CM | POA: Diagnosis present

## 2022-03-15 DIAGNOSIS — Z6828 Body mass index (BMI) 28.0-28.9, adult: Secondary | ICD-10-CM | POA: Diagnosis not present

## 2022-03-15 DIAGNOSIS — Z825 Family history of asthma and other chronic lower respiratory diseases: Secondary | ICD-10-CM | POA: Diagnosis not present

## 2022-03-15 DIAGNOSIS — R4701 Aphasia: Principal | ICD-10-CM

## 2022-03-15 DIAGNOSIS — F802 Mixed receptive-expressive language disorder: Secondary | ICD-10-CM | POA: Diagnosis present

## 2022-03-15 DIAGNOSIS — I6389 Other cerebral infarction: Secondary | ICD-10-CM | POA: Diagnosis not present

## 2022-03-15 DIAGNOSIS — M1711 Unilateral primary osteoarthritis, right knee: Secondary | ICD-10-CM | POA: Diagnosis present

## 2022-03-15 DIAGNOSIS — R531 Weakness: Secondary | ICD-10-CM | POA: Diagnosis present

## 2022-03-15 LAB — DIFFERENTIAL
Abs Immature Granulocytes: 0.03 10*3/uL (ref 0.00–0.07)
Basophils Absolute: 0 10*3/uL (ref 0.0–0.1)
Basophils Relative: 0 %
Eosinophils Absolute: 0.1 10*3/uL (ref 0.0–0.5)
Eosinophils Relative: 1 %
Immature Granulocytes: 0 %
Lymphocytes Relative: 22 %
Lymphs Abs: 2 10*3/uL (ref 0.7–4.0)
Monocytes Absolute: 0.6 10*3/uL (ref 0.1–1.0)
Monocytes Relative: 7 %
Neutro Abs: 6.1 10*3/uL (ref 1.7–7.7)
Neutrophils Relative %: 70 %

## 2022-03-15 LAB — COMPREHENSIVE METABOLIC PANEL
ALT: 27 U/L (ref 0–44)
AST: 25 U/L (ref 15–41)
Albumin: 3.6 g/dL (ref 3.5–5.0)
Alkaline Phosphatase: 64 U/L (ref 38–126)
Anion gap: 12 (ref 5–15)
BUN: 32 mg/dL — ABNORMAL HIGH (ref 8–23)
CO2: 24 mmol/L (ref 22–32)
Calcium: 8.9 mg/dL (ref 8.9–10.3)
Chloride: 101 mmol/L (ref 98–111)
Creatinine, Ser: 2.87 mg/dL — ABNORMAL HIGH (ref 0.61–1.24)
GFR, Estimated: 22 mL/min — ABNORMAL LOW (ref >60)
Glucose, Bld: 91 mg/dL (ref 70–99)
Potassium: 2.9 mmol/L — ABNORMAL LOW (ref 3.5–5.1)
Sodium: 137 mmol/L (ref 135–145)
Total Bilirubin: 1 mg/dL (ref 0.3–1.2)
Total Protein: 6.9 g/dL (ref 6.5–8.1)

## 2022-03-15 LAB — CBC
HCT: 36.2 % — ABNORMAL LOW (ref 39.0–52.0)
Hemoglobin: 11.9 g/dL — ABNORMAL LOW (ref 13.0–17.0)
MCH: 32.2 pg (ref 26.0–34.0)
MCHC: 32.9 g/dL (ref 30.0–36.0)
MCV: 98.1 fL (ref 80.0–100.0)
Platelets: 130 10*3/uL — ABNORMAL LOW (ref 150–400)
RBC: 3.69 MIL/uL — ABNORMAL LOW (ref 4.22–5.81)
RDW: 13 % (ref 11.5–15.5)
WBC: 8.9 10*3/uL (ref 4.0–10.5)
nRBC: 0 % (ref 0.0–0.2)

## 2022-03-15 LAB — I-STAT CHEM 8, ED
BUN: 35 mg/dL — ABNORMAL HIGH (ref 8–23)
Calcium, Ion: 1.06 mmol/L — ABNORMAL LOW (ref 1.15–1.40)
Chloride: 106 mmol/L (ref 98–111)
Creatinine, Ser: 3.1 mg/dL — ABNORMAL HIGH (ref 0.61–1.24)
Glucose, Bld: 138 mg/dL — ABNORMAL HIGH (ref 70–99)
HCT: 46 % (ref 39.0–52.0)
Hemoglobin: 15.6 g/dL (ref 13.0–17.0)
Potassium: 3.5 mmol/L (ref 3.5–5.1)
Sodium: 141 mmol/L (ref 135–145)
TCO2: 22 mmol/L (ref 22–32)

## 2022-03-15 LAB — HEMOGLOBIN A1C
Hgb A1c MFr Bld: 5.3 % (ref 4.8–5.6)
Mean Plasma Glucose: 105.41 mg/dL

## 2022-03-15 LAB — CBG MONITORING, ED: Glucose-Capillary: 157 mg/dL — ABNORMAL HIGH (ref 70–99)

## 2022-03-15 LAB — ETHANOL: Alcohol, Ethyl (B): <10 mg/dL (ref <10)

## 2022-03-15 MED ORDER — SENNOSIDES-DOCUSATE SODIUM 8.6-50 MG PO TABS: 1.0000 | ORAL_TABLET | Freq: Every evening | ORAL | Status: DC | PRN

## 2022-03-15 MED ORDER — GABAPENTIN 400 MG PO CAPS
800.0000 mg | ORAL_CAPSULE | Freq: Two times a day (BID) | ORAL | Status: DC
  Administered 2022-03-15 – 2022-03-16 (×3): 800 mg via ORAL
  Filled 2022-03-15 (×4): qty 2

## 2022-03-15 MED ORDER — LACTATED RINGERS IV BOLUS: 1000.0000 mL | Freq: Once | INTRAVENOUS | Status: DC

## 2022-03-15 MED ORDER — ACETAMINOPHEN 650 MG RE SUPP: 650.0000 mg | RECTAL | Status: DC | PRN

## 2022-03-15 MED ORDER — PANTOPRAZOLE SODIUM 40 MG IV SOLR
40.0000 mg | Freq: Every day | INTRAVENOUS | Status: DC
  Administered 2022-03-15: 40 mg via INTRAVENOUS
  Filled 2022-03-15: qty 10

## 2022-03-15 MED ORDER — NICARDIPINE HCL IN NACL 20-0.86 MG/200ML-% IV SOLN: 0.0000 mg/h | INTRAVENOUS | Status: DC

## 2022-03-15 MED ORDER — SODIUM CHLORIDE 0.9 % IV BOLUS
1000.0000 mL | Freq: Once | INTRAVENOUS | Status: AC
  Administered 2022-03-15: 1000 mL via INTRAVENOUS

## 2022-03-15 MED ORDER — SODIUM CHLORIDE 0.9 % IV SOLN: INTRAVENOUS | Status: DC

## 2022-03-15 MED ORDER — IOHEXOL 350 MG/ML SOLN
75.0000 mL | Freq: Once | INTRAVENOUS | Status: AC | PRN
  Administered 2022-03-15: 75 mL via INTRAVENOUS

## 2022-03-15 MED ORDER — TENECTEPLASE FOR STROKE
0.2500 mg/kg | PACK | Freq: Once | INTRAVENOUS | Status: AC
  Administered 2022-03-15: 22 mg via INTRAVENOUS
  Filled 2022-03-15: qty 10

## 2022-03-15 MED ORDER — ACETAMINOPHEN 325 MG PO TABS
650.0000 mg | ORAL_TABLET | ORAL | Status: DC | PRN
  Administered 2022-03-17 (×2): 650 mg via ORAL
  Filled 2022-03-15 (×2): qty 2

## 2022-03-15 MED ORDER — LABETALOL HCL 5 MG/ML IV SOLN: 20.0000 mg | Freq: Once | INTRAVENOUS | Status: DC

## 2022-03-15 MED ORDER — SODIUM CHLORIDE 0.9% FLUSH
3.0000 mL | Freq: Once | INTRAVENOUS | Status: AC
  Administered 2022-03-15: 3 mL via INTRAVENOUS

## 2022-03-15 MED ORDER — LABETALOL HCL 5 MG/ML IV SOLN: 5.0000 mg | INTRAVENOUS | Status: DC | PRN

## 2022-03-15 MED ORDER — STROKE: EARLY STAGES OF RECOVERY BOOK
Freq: Once | Status: AC
  Filled 2022-03-15: qty 1

## 2022-03-15 MED ORDER — ACETAMINOPHEN 160 MG/5ML PO SOLN: 650.0000 mg | ORAL | Status: DC | PRN

## 2022-03-15 NOTE — ED Provider Notes (Signed)
[facility] EMERGENCY DEPARTMENT Provider Note   CSN: [id number] Arrival date & time: 03/15/22  1840  An emergency department physician performed an initial assessment on this suspected stroke patient at [place] [facility].  History  Chief Complaint  Patient presents with   Code Stroke    Kurt Parsons is a 76 y.o. male.  HPI  Patient is presented to the emergency department as a code stroke.  Patient was reportedly last known normal at approximately 5 PM.  Patient's wife reportedly found him with concern for right-sided facial droop and aphasia.  Patient was reportedly nonsensical on scene and was speaking only Arabic, patient is able to speak with [place] and Arabic.  In route with EMS, patient was not following commands, he had apparent right-sided deficits.  Patient's blood glucose in route with EMS was approximately 180.  Patient was hemodynamically stable, not significantly hypertensive.  Patient did have singular episode of hypotension with systolics in the 10F, patient received small fluid bolus with EMS    Home Medications Prior to Admission medications   Medication Sig Start Date End Date Taking? Authorizing Provider  aspirin EC 81 MG tablet Take 1 tablet (81 mg total) by mouth daily. 02/03/11   Larey Dresser, MD  citalopram (CELEXA) 20 MG tablet Take 1 tablet (20 mg total) by mouth daily. 07/16/14   Dorena Cookey, MD  Docusate Sodium (COLACE PO) Take 1 capsule by mouth daily as needed (for constipation).     [provider]  gabapentin (NEURONTIN) 800 MG tablet take 1 tablet by mouth twice a day 10/22/16   Dorena Cookey, MD  HYDROcodone-homatropine [facility]) 5-1.5 MG/5ML syrup Take 5 mLs by mouth every 8 (eight) hours as needed for cough. 11/27/14   Lucretia Kern, DO  LORazepam (ATIVAN) 0.5 MG tablet Half tab at bedtime as needed. 04/09/13   Dorena Cookey, MD  omeprazole (PRILOSEC) 40 MG capsule Take 1 capsule (40 mg total) by mouth daily. 07/16/14   Dorena Cookey, MD  simvastatin (ZOCOR) 20 MG tablet Take 1 tablet (20 mg total) by mouth at bedtime. 07/16/14   Dorena Cookey, MD      Allergies    Lisinopril    Review of Systems   Review of Systems  Physical Exam Updated Vital Signs BP 109/70   Pulse 61   Temp 98.1 F (36.7 C)   Resp 16   Wt 89.8 kg   SpO2 97%   BMI 28.41 kg/m  Physical Exam Constitutional:      Appearance: He is obese. He is ill-appearing.  HENT:     Head: Normocephalic and atraumatic.     Mouth/Throat:     Mouth: Mucous membranes are dry.  Eyes:     Extraocular Movements: Extraocular movements intact.     Pupils: Pupils are equal, round, and reactive to light.  Cardiovascular:     Rate and Rhythm: Normal rate and regular rhythm.     Pulses: Normal pulses.  Pulmonary:     Effort: Pulmonary effort is normal.     Breath sounds: Normal breath sounds.  Abdominal:     General: Abdomen is flat.     Palpations: Abdomen is soft.  Skin:    General: Skin is warm and dry.  Neurological:     Mental Status: He is alert. He is disoriented.     Comments: Aphasic, altered, unable to follow commands, limited participation in neurologic exam.  Dense right-sided upper and lower extremity  weakness.  Will not withdraw to pain on the right     ED Results / Procedures / Treatments   Labs (all labs ordered are listed, but only abnormal results are displayed) Labs Reviewed  CBC - Abnormal; Notable for the following components:      Result Value   RBC 3.69 (*)    Hemoglobin 11.9 (*)    HCT 36.2 (*)    Platelets 130 (*)    All other components within normal limits  COMPREHENSIVE METABOLIC PANEL - Abnormal; Notable for the following components:   Potassium 2.9 (*)    BUN 32 (*)    Creatinine, Ser 2.87 (*)    GFR, Estimated 22 (*)    All other components within normal limits  I-STAT CHEM 8, ED - Abnormal; Notable for the following components:   BUN 35 (*)    Creatinine, Ser 3.10 (*)    Glucose, Bld 138 (*)     Calcium, Ion 1.06 (*)    All other components within normal limits  CBG MONITORING, ED - Abnormal; Notable for the following components:   Glucose-Capillary 157 (*)    All other components within normal limits  MRSA NEXT GEN BY PCR, NASAL  DIFFERENTIAL  ETHANOL  HEMOGLOBIN A1C  LIPID PANEL  CBC  COMPREHENSIVE METABOLIC PANEL  RAPID URINE DRUG SCREEN, HOSP PERFORMED  URINALYSIS, COMPLETE (UACMP) WITH MICROSCOPIC  PROTIME-INR  APTT    EKG None  Radiology CT ANGIO HEAD NECK W WO CM (CODE STROKE)  Addendum Date: 03/15/2022   ADDENDUM REPORT: 03/15/2022 19:37 ADDENDUM: There is 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 Discussed with Dr. Curly Shores 7:36 p.m. on 03/15/2022. Electronically Signed   By: Ulyses Jarred M.D.   On: 03/15/2022 19:37   Result Date: 03/15/2022 CLINICAL DATA:  Bilateral weakness. EXAM: CT ANGIOGRAPHY HEAD AND NECK TECHNIQUE: Multidetector CT imaging of the head and neck was performed using the standard protocol during bolus administration of intravenous contrast. Multiplanar CT image reconstructions and MIPs were obtained to evaluate the vascular anatomy. Carotid stenosis measurements (when applicable) are obtained utilizing NASCET criteria, using the distal internal carotid diameter as the denominator. RADIATION DOSE REDUCTION: This exam was performed according to the departmental dose-optimization program which includes automated exposure control, adjustment of the mA and/or kV according to patient size and/or use of iterative reconstruction technique. CONTRAST:  75 mL Omnipaque 350 COMPARISON:  None Available. FINDINGS: CTA NECK FINDINGS SKELETON: There is no bony spinal canal stenosis. No lytic or blastic lesion. C5-7 ACDF. OTHER NECK: Normal pharynx, larynx and major salivary glands. No cervical lymphadenopathy. Unremarkable thyroid gland. UPPER CHEST: No pneumothorax or pleural effusion. No nodules or  masses. AORTIC ARCH: There is calcific atherosclerosis of the aortic arch. There is no aneurysm, dissection or hemodynamically significant stenosis of the visualized portion of the aorta. Conventional 3 vessel aortic branching pattern. The visualized proximal subclavian arteries are widely patent. RIGHT CAROTID SYSTEM: No dissection, occlusion or aneurysm. Mild atherosclerotic calcification at the carotid bifurcation without hemodynamically significant stenosis. LEFT CAROTID SYSTEM: Normal without aneurysm, dissection or stenosis. VERTEBRAL ARTERIES: Left dominant configuration. Both origins are clearly patent. There is no dissection, occlusion or flow-limiting stenosis to the skull base (V1-V3 segments). CTA HEAD FINDINGS POSTERIOR CIRCULATION: --Vertebral arteries: Normal V4 segments. --Inferior cerebellar arteries: Normal. --Basilar artery: Normal. --Superior cerebellar arteries: Normal. --Posterior cerebral arteries (PCA): Normal. ANTERIOR CIRCULATION: --Intracranial internal carotid arteries: Normal. --Anterior cerebral arteries (ACA):  Normal. Both A1 segments are present. Patent anterior communicating artery (a-comm). --Middle cerebral arteries (MCA): Normal. VENOUS SINUSES: As permitted by contrast timing, patent. ANATOMIC VARIANTS: Fetal origin of the right posterior cerebral artery. Review of the MIP images confirms the above findings. IMPRESSION: 1. No emergent large vessel occlusion or hemodynamically significant stenosis of the head or neck. 2. Mild right carotid bifurcation atherosclerosis without hemodynamically significant stenosis. Aortic atherosclerosis (ICD10-I70.0). Electronically Signed: By: Ulyses Jarred M.D. On: 03/15/2022 19:14   CT HEAD CODE STROKE WO CONTRAST  Result Date: 03/15/2022 CLINICAL DATA:  Code stroke.  Bilateral weakness EXAM: CT HEAD WITHOUT CONTRAST TECHNIQUE: Contiguous axial images were obtained from the base of the skull through the vertex without intravenous contrast.  RADIATION DOSE REDUCTION: This exam was performed according to the departmental dose-optimization program which includes automated exposure control, adjustment of the mA and/or kV according to patient size and/or use of iterative reconstruction technique. COMPARISON:  None Available. FINDINGS: Brain: There is no mass, hemorrhage or extra-axial collection. The size and configuration of the ventricles and extra-axial CSF spaces are normal. The brain parenchyma is normal, without evidence of acute or chronic infarction. Vascular: No abnormal hyperdensity of the major intracranial arteries or dural venous sinuses. No intracranial atherosclerosis. Skull: The visualized skull base, calvarium and extracranial soft tissues are normal. Sinuses/Orbits: No fluid levels or advanced mucosal thickening of the visualized paranasal sinuses. No mastoid or middle ear effusion. The orbits are normal. ASPECTS [facility] Stroke Program Early CT Score) - Ganglionic level infarction (caudate, lentiform nuclei, internal capsule, insula, M1-M3 cortex): 7 - Supraganglionic infarction (M4-M6 cortex): 3 Total score (0-10 with 10 being normal): 10 IMPRESSION: 1. No acute intracranial abnormality. 2. ASPECTS is 10. These results were communicated to Dr. Lesleigh Noe at 7:04 pm on 03/15/2022 by text page via the [facility] messaging system. Electronically Signed   By: Ulyses Jarred M.D.   On: 03/15/2022 19:09    Procedures Procedures    Medications Ordered in ED Medications   stroke: early stages of recovery book (has no administration in time range)  0.9 %  sodium chloride infusion ( Intravenous IV Pump [employer] 03/16/22 0051)  acetaminophen (TYLENOL) tablet 650 mg (has no administration in time range)    Or  acetaminophen (TYLENOL) 160 MG/5ML solution 650 mg (has no administration in time range)    Or  acetaminophen (TYLENOL) suppository 650 mg (has no administration in time range)  senna-docusate (Senokot-S) tablet 1 tablet (has no  administration in time range)  pantoprazole (PROTONIX) injection 40 mg (40 mg Intravenous Given 03/15/22 2258)  labetalol (NORMODYNE) injection 5 mg (has no administration in time range)  gabapentin (NEURONTIN) capsule 800 mg (800 mg Oral Given 03/15/22 2342)  Chlorhexidine Gluconate Cloth 2 % PADS 6 each (has no administration in time range)  Oral care mouth rinse (has no administration in time range)  sodium chloride flush (NS) 0.9 % injection 3 mL (3 mLs Intravenous Given 03/15/22 1948)  iohexol (OMNIPAQUE) 350 MG/ML injection 75 mL (75 mLs Intravenous Contrast Given 03/15/22 1904)  tenecteplase (TNKASE) injection for Stroke 22 mg (22 mg Intravenous Given 03/15/22 1914)  sodium chloride 0.9 % bolus 1,000 mL (0 mLs Intravenous Stopped 03/15/22 2306)    ED Course/ Medical Decision Making/ A&P                          Medical Decision Making Amount and/or Complexity of Data Reviewed Labs: ordered. Decision-making details documented in ED  Course. Radiology: ordered. Decision-making details documented in ED Course. ECG/medicine tests:  Decision-making details documented in ED Course.  Risk Decision regarding hospitalization.   Medical Decision Making  This patient is Presenting for Evaluation of aphasia, right-sided deficits, patient has a past medical history of hypertension, hyperlipidemia, lung cancer in remission which complicates their presentation.  Of which does require a range of treatment options, and is a complaint that involves a high risk of morbidity and mortality.  Arrived in ED by:  EMS History obtained from: The patient's family Limitations in history: Patient's aphasia, altered mental status   At this time I am most concerned for ischemic stroke. Also considering hemorrhagic stroke, seizure, carotid artery stenosis, metabolic or hematologic abnormality, encephalopathy. Plan for laboratory and imaging study work-up   Laboratory work-up significant for:  -laboratory  work-up obtained revealed no significant hematologic abnormality outside of a mild thrombocytopenia with platelet count of 130, not a transfusion threshold.   Radiologic work-up was significant for: -CT head with no acute abnormality -CTA head and neck no LVO noted or significant stenosis of the head neck.  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  Patient had noted right-sided facial droop and dense right-sided deficits.  Neuro at the bedside on patient arrival.  Patient taken to CT scanner immediately.  Patient is not on any anticoagulation.  Point-of-care glucose on arrival 157.  -Neurology evaluated the patient in CT scanner, after the patient negative CT head for acute intracranial abnormality, patient is felt to be appropriate for TNK at this time and being within the appropriate window.  TNKase administered -On repeat evaluation following administration of TNK, patient had return of his speech, while it was nonsensical, the patient was able to articulate and say words in [place].  Patient CT imaging study concerning for that of a MCA stroke.  Following the administration of TNK, patient will require admission to the neuro ICU for additional neurologic evaluation, every neurochecks, and additional stroke study work-up. -Patient had a significant AKI with creatinine of 2.8, after speaking with the patient's family, it appears that the patient has had relatively poor p.o. intake over the past few days as he has been grieving the loss of his dog.  He also has had a diarrheal illness for the past 2 to 3 days, this is likely the etiology of the patient's significant AKI, patient given fluid resuscitation in the emergency department.     Disposition: Due to the patients current presenting symptoms, physical exam findings, and the  workup stated above, it is thought that the etiology of the patients current presentation is ischemic stroke    ADMIT: Patient is thought to require admission for neurologic monitoring, additional laboratory and imaging study work-up. Patient will be admitted to neuro ICU service. Please see in patient provider note for additional treatment plan details.     The plan for this patient was discussed with Dr. Darl Householder, who voiced agreement and who oversaw evaluation and treatment of this patient.     Clinical Complexity  A medically appropriate history, review of systems, and physical exam was performed.   I personally reviewed the lab and imaging studies discussed above.   MDM generated using voice dictation software and may contain dictation errors. Please contact me for any clarification or with any questions.  Final Clinical Impression(s) / ED Diagnoses Final diagnoses:  Aphasia    Rx / DC Orders ED Discharge Orders     None         Jazmine Heckman, [place], MD 03/16/22 0107    Drenda Freeze, MD 03/16/22 1622

## 2022-03-15 NOTE — ED Triage Notes (Signed)
Pt bib ems from home; LKW 1700; per pt's wife, pt confused with slurred speech; normally speaks [place] and french, started speaking arabic to her; not following commands, R facial droop; cbg 172; 112/50 (initially hypotensive 88 palp), pt give 45 NS PTA

## 2022-03-15 NOTE — Progress Notes (Signed)
PHARMACIST CODE STROKE RESPONSE  Notified to mix TNK at 1911 by Dr. Curly Shores TNK preparation completed at 1913  TNK dose = 22 mg IV over 5 seconds.   Issues/delays encountered (if applicable):   Bertis Ruddy 03/15/22 7:27 PM

## 2022-03-15 NOTE — H&P (Addendum)
Neurology H&P  Reason for Consult: Code Stroke Referring Physician: Dr. Darl Householder  CC: right-sided weakness, speech disturbance  History is obtained from: EMS, Chart review  HPI: Kurt Parsons is a 76 y.o. male with a medical history significant for essential hypertension, hyperlipidemia, obesity, nonischemic cell lung cancer s/p resection (03/2021), C5-C6 posterior cervical decompression (1994), C6 5-C7 ACDF (left side, 19 95/1996) who presented to the ED on 10/9 via EMS for evaluation of right-sided weakness and speech disturbance. Per patient's wife patient was in his usual state of health at 17:00 working on his computer using both upper extremities equally and drinking ginger ale but at 17:15 the patient began to seem confused with a right facial droop, was leaning towards the right on ambulating with right upper extremity weakness as well and EMS was activated. Patient's wife did note that the patient had nausea, vomiting, and diaphoresis overnight last night but had a ginger ale today and some rice and told his wife that he was feeling "much better". During EMS transport today, the patient became unresponsive with EMS and was not following commands. Patient was subsequently activated as a code stroke for further evaluation.   Wife reported he did have a right shoulder steroid injection approximately 1 month ago.  On review of records after emergent decision was made, I notes that orthopedic evaluation was unrevealing for etiology of right upper extremity weakness and he was referred to neurology for further evaluation  LKW: 1700 TNK given?: yes, risks and benefits were discussed with patient's wife at bedside who consented to TNK administration at 1912, given at [place] [facility] Thrombectomy? No, no LVO Modified Rankin Scale: 0-Completely asymptomatic and back to baseline post- stroke  ROS: Unable to obtain due to altered mental status.   Past Medical History:  Diagnosis Date   Back pain    Chronic  pain syndrome    takes Morphine daily   Constipation    takes Colace daily   Dizziness    occasionally   ED (erectile dysfunction)    takes Celexa daily   Gallbladder sludge    GERD (gastroesophageal reflux disease)    takes Omeprazole daily   Glaucoma    mild per pt   Hyperlipidemia    takes Zocor nightly   Hypertension    takes Lisinopril daily   Inguinal hernia    Internal hemorrhoids    Lazy eye rt eye   Liver cyst    Neck pain    Neuropathy    Right hydrocele    Sleep disturbance    takes Ambien prn   Ulcer    Past Surgical History:  Procedure Laterality Date   BACK SURGERY     bilateral cataract removed     CERVICAL DISC SURGERY     x 2   COLONOSCOPY     HEMORRHOID SURGERY     INGUINAL HERNIA REPAIR Right 03/21/2013   Procedure: HERNIA REPAIR INGUINAL ADULT;  Surgeon: Imogene Burn. Georgette Dover, MD;  Location: [facility] [employer];  Service: General;  Laterality: Right;   INSERTION OF MESH Right 03/21/2013   Procedure: INSERTION OF MESH;  Surgeon: Imogene Burn. Georgette Dover, MD;  Location: [facility];  Service: General;  Laterality: Right;   LUMBAR DISC SURGERY     x 2   NECK SURGERY     Family History  Problem Relation Age of Onset   Diabetes Other    Hypertension Other    Stroke Other    Heart disease Other    COPD  Other    Social History:   reports that he has quit smoking. He has never used smokeless tobacco. He reports that he does not drink alcohol and does not use drugs.  Medications  Current Facility-Administered Medications:    sodium chloride flush (NS) 0.9 % injection 3 mL, 3 mL, Intravenous, Once, Drenda Freeze, MD  Current Outpatient Medications:    aspirin EC 81 MG tablet, Take 1 tablet (81 mg total) by mouth daily., Disp: , Rfl:    citalopram (CELEXA) 20 MG tablet, Take 1 tablet (20 mg total) by mouth daily., Disp: 100 tablet, Rfl: 3   Docusate Sodium (COLACE PO), Take 1 capsule by mouth daily as needed (for constipation). , Disp: , Rfl:    gabapentin (NEURONTIN) 800  MG tablet, take 1 tablet by mouth twice a day, Disp: 60 tablet, Rfl: 0   HYDROcodone-homatropine (HYCODAN) 5-1.5 MG/5ML syrup, Take 5 mLs by mouth every 8 (eight) hours as needed for cough., Disp: 120 mL, Rfl: 0   lisinopril (PRINIVIL,ZESTRIL) 10 MG tablet, take 1 tablet by mouth once daily, Disp: 90 tablet, Rfl: 0   LORazepam (ATIVAN) 0.5 MG tablet, Half tab at bedtime as needed., Disp: 60 tablet, Rfl: 3   omeprazole (PRILOSEC) 40 MG capsule, Take 1 capsule (40 mg total) by mouth daily., Disp: 100 capsule, Rfl: 3   simvastatin (ZOCOR) 20 MG tablet, Take 1 tablet (20 mg total) by mouth at bedtime., Disp: 100 tablet, Rfl: 3  Per 02/26/2022 orthopedic visit: Current Outpatient Medications  Medication Sig Dispense Refill  amLODIPine (NORVASC) 10 MG tablet Take 1 tablet (10 mg total) by mouth daily. (Patient taking differently: Take 1 tablet (10 mg total) by mouth every morning.) 90 tablet 4  aspirin (ECOTRIN) 81 MG tablet Take 1 tablet (81 mg total) by mouth daily. (Patient taking differently: Take 1 tablet (81 mg total) by mouth every morning.) 0  cyanocobalamin, vitamin B-12, 1000 MCG tablet Take 1 tablet (1,000 mcg total) by mouth daily. (Patient taking differently: Take 1 tablet (1,000 mcg total) by mouth every morning.) 90 tablet 3  gabapentin (NEURONTIN) 100 MG capsule Take 1 capsule (100 mg total) by mouth Three (3) times a day. Take 100 MG 3 times a day WITH 800 MG dose for total of 900 MG TID. (See separate Rx for '800MG'$ ) 270 capsule 3  gabapentin (NEURONTIN) 800 MG tablet Take 1 tablet (800 mg total) by mouth Three (3) times a day. 90 tablet 11  hydroCHLOROthiazide (HYDRODIURIL) 25 MG tablet Take 1 tablet (25 mg total) by mouth daily. (Patient taking differently: Take 1 tablet (25 mg total) by mouth every morning.) 90 tablet 4  ibuprofen (ADVIL,MOTRIN) 200 MG tablet Take 1 tablet (200 mg total) by mouth every eight (8) hours as needed for pain.  olmesartan (BENICAR) 20 MG tablet Take 1 tablet  (20 mg total) by mouth daily. (Patient taking differently: Take 1 tablet (20 mg total) by mouth every morning.) 30 tablet 11  omeprazole (PRILOSEC) 40 MG capsule Take 1 capsule (40 mg total) by mouth daily. (Patient taking differently: Take 1 capsule (40 mg total) by mouth daily with evening meal.) 90 capsule 4  simvastatin (ZOCOR) 20 MG tablet Take 1 tablet (20 mg total) by mouth nightly. 90 tablet 4  tiZANidine (ZANAFLEX) 2 MG tablet Take 1 tablet (2 mg total) by mouth nightly as needed. 30 tablet 0  venlafaxine (EFFEXOR-XR) 75 MG 24 hr capsule Take 1 capsule (75 mg total) by mouth daily. (Patient taking differently: Take  1 capsule (75 mg total) by mouth every morning.) 90 capsule 4  chlorhexidine (PERIDEX) 0.12 % solution 15 mL by Mouth route Two (2) times a day. (Patient taking differently: 15 mL by Mouth route every morning.) 473 mL 2   Exam: Current vital signs: Wt 89.8 kg   BMI 28.41 kg/m  Vital signs in last 24 hours: Weight:  [89.8 kg] 89.8 kg (10/09 1800)  GENERAL: Drowsy, appears acutely ill, laying in ER stretcher Psych: Not cooperative with exam due to patient's mental status Head: Normocephalic and atraumatic, without obvious abnormality EENT: Normal conjunctivae, dry mucous membranes, no OP obstruction LUNGS: Normal respiratory effort. Non-labored breathing on room air CV: Regular rate and rhythm on telemetry ABDOMEN: Soft, non-tender, non-distended Extremities: Warm, well perfused, without obvious deformity Rectal exam without evidence of melena  NEURO:  Mental Status: Drowsy, wakes to voice. He does not answer orientation questions. Does not follow commands.  He is unable to provide a clear or coherent history of present illness.  Speech/Language: speech is dysarthric and mostly unintelligible.  He does not name objects or repeat phrases.  There is some right-sided neglect Cranial Nerves:  II: PERRL  III, IV, VI: Left gaze preference. Will briefly cross midline and  look to the right. V: Does not blink to threat on the right  VII: Right facial droop  VIII: Hearing is intact to voice IX, X: Phonation normal.  XI: Head is grossly midline XII: Does not protrude tongue to command  Motor: Right upper and lower extremity weakness noted.  Left upper extremity elevates antigravity without vertical drift.  Right upper and lower extremities move without gravity  Left lower extremity has minimal vertical drift Tone is normal. Bulk is normal.  Sensation: Decreased sensation to the right upper and lower extremity, minimal response to noxious stimuli, brisk grimace on the left to noxious stimuli Coordination: Does not perform Gait: Deferred  NIHSS: 1a Level of Conscious.: 1 1b LOC Questions: 2 1c LOC Commands: 2 2 Best Gaze: 1 3 Visual: 2 4 Facial Palsy: 1 5a Motor Arm - left: 0 5b Motor Arm - Right: 3 6a Motor Leg - Left: 1 6b Motor Leg - Right: 3 7 Limb Ataxia: 0 8 Sensory: 1 9 Best Language: 2 10 Dysarthria: 1 11 Extinct. and Inatten.: 1 TOTAL: 19  Examination improving on subsequent evaluation with resolution of field cut, gaze preference, and improvement in speech although still abnormal.  Right-sided weakness was also noted to be improving  Labs I have reviewed labs in epic and the results pertinent to this consultation are: CBC    Component Value Date/Time   WBC 8.9 03/15/2022 1859   RBC 3.69 (L) 03/15/2022 1859   HGB 11.9 (L) 03/15/2022 1859   HGB 15.6 03/15/2022 1859   HCT 36.2 (L) 03/15/2022 1859   HCT 46.0 03/15/2022 1859   PLT 130 (L) 03/15/2022 1859   MCV 98.1 03/15/2022 1859   MCH 32.2 03/15/2022 1859   MCHC 32.9 03/15/2022 1859   RDW 13.0 03/15/2022 1859   LYMPHSABS 2.0 03/15/2022 1859   MONOABS 0.6 03/15/2022 1859   EOSABS 0.1 03/15/2022 1859   BASOSABS 0.0 03/15/2022 1859   CMP     Component Value Date/Time   NA 137 03/15/2022 1940   K 2.9 (L) 03/15/2022 1940   CL 101 03/15/2022 1940   CO2 24 03/15/2022 1940    GLUCOSE 91 03/15/2022 1940   GLUCOSE 85 04/22/2006 0827   BUN 32 (H) 03/15/2022 1940  CREATININE 2.87 (H) 03/15/2022 1940   CALCIUM 8.9 03/15/2022 1940   PROT 6.9 03/15/2022 1940   ALBUMIN 3.6 03/15/2022 1940   AST 25 03/15/2022 1940   ALT 27 03/15/2022 1940   ALKPHOS 64 03/15/2022 1940   BILITOT 1.0 03/15/2022 1940   GFRNONAA 22 (L) 03/15/2022 1940   GFRAA 70 (L) 03/13/2013 1027  Last prior creatinine 1.1 in April   Lipid Panel     Component Value Date/Time   CHOL 183 07/16/2014 1049   TRIG 103.0 07/16/2014 1049   TRIG 72 04/22/2006 0827   HDL 51.40 07/16/2014 1049   CHOLHDL 4 07/16/2014 1049   VLDL 20.6 07/16/2014 1049   LDLCALC 111 (H) 07/16/2014 1049  No results found for: "HGBA1C"  Imaging I have reviewed the images obtained:  CT-scan of the brain 0/9: 1. No acute intracranial abnormality. 2. ASPECTS is 10.  CTA Head and neck 10/9: 1. No emergent large vessel occlusion or hemodynamically significant stenosis of the head or neck. 2. Mild right carotid bifurcation atherosclerosis without hemodynamically significant stenosis. 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  Assessment: Acute onset aphasia concerning for stroke with etiology to be determined based on further work-up.  Given his hypotension and significant stenosis in the left MCA territory, symptomatic hypoperfusion versus atheroembolic stroke is possible.  He does seem to be significantly volume down in the setting of not eating for several days due to grief over his dog's death compounded by an acute GI illness.  On additional history family does report some delirium during prior surgeries but they deny any other episodes of language impairment.  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 - Stroke labs HgbA1c, fasting lipid panel - MRI brain  - Frequent neuro checks - Echocardiogram - Hold antiplatelet agents until 24-hour stability scan confirms no significant hemorrhagic conversion - Risk factor modification - Telemetry monitoring; 30 day event monitor on discharge if no arrythmias captured  - Blood pressure goal   - Post TNK for 24  hours < 180/105 - PT consult, OT consult, Speech consult, unless patient is back to baseline - Admitted to stroke team  # AKI (baseline creatinine 1.1) - Verbal order for 1 L normal saline, ED provider additionally ordered lactated Ringer's - Continue 75 mL per hour normal saline and continue to monitor  # Mild thrombocytopenia - This appears stable since October 2022  # Recent GI illness - Afebrile without significant leukocytosis at this time, continue to monitor  Home medication reconciliation pending, no critical medications apparent on my review of his outside notes  Pt seen by NP/Neuro and later by MD. Note/plan to be edited by MD as needed.  Anibal Henderson, AGAC-NP [facility] Pager: [phone]  Attending Neurologist's note:  I personally saw this patient, gathering history, performing a full neurologic examination, reviewing relevant labs, personally reviewing relevant imaging including head CT and CTA, and formulated the assessment and plan, adding the note above for completeness and clarity to accurately reflect my thoughts  CRITICAL CARE Performed by: Lorenza Chick   Total critical care time: 60 minutes  Critical care time was exclusive of separately billable procedures and  treating other patients.  Critical care was necessary to treat or prevent imminent or life-threatening deterioration.  Critical care was time spent personally by me on the following activities:  development of treatment plan with patient and/or surrogate as well as nursing, discussions with consultants, evaluation of patient's response to treatment, examination of patient, obtaining history from patient or surrogate, ordering and performing treatments and interventions, ordering and review of laboratory studies, ordering and review of radiographic studies, pulse oximetry and re-evaluation of patient's condition.

## 2022-03-15 NOTE — ED Notes (Signed)
ED TO INPATIENT HANDOFF REPORT  ED Nurse Name and Phone #: [name] Name/Age/Gender Kurt Parsons 76 y.o. male Room/Bed: 019C/019C  Code Status   Code Status: Full Code  Home/SNF/Other Home Patient oriented to: self, place, and time Is this baseline? Yes   Triage Complete: Triage complete  Chief Complaint Stroke [facility]) [I63.9]  Triage Note Pt bib ems from home; LKW 1700; per pt's wife, pt confused with slurred speech; normally speaks [place] and french, started speaking arabic to her; not following commands, R facial droop; cbg 172; 112/50 (initially hypotensive 88 palp), pt give 45 NS PTA   Allergies Allergies  Allergen Reactions   Lisinopril Swelling    angioedema    Level of Care/Admitting Diagnosis ED Disposition     ED Disposition  Admit   Condition  --   [facility]: [facility] [[id number]]  Level of Care: ICU [6]  May admit patient to [facility] or [facility] if equivalent level of care is available:: No  Covid Evaluation: Asymptomatic - no recent exposure (last 10 days) testing not required  Diagnosis: Stroke [facility]) [[id number]]  Admitting Physician: Lorenza Chick [[id number]]  Attending Physician: Flint Melter, MD [[id number]]  Certification:: I certify this patient will need inpatient services for at least 2 midnights  Estimated Length of Stay: 2          B Medical/Surgery History Past Medical History:  Diagnosis Date   Back pain    Chronic pain syndrome    takes Morphine daily   Constipation    takes Colace daily   Dizziness    occasionally   ED (erectile dysfunction)    takes Celexa daily   Gallbladder sludge    GERD (gastroesophageal reflux disease)    takes Omeprazole daily   Glaucoma    mild per pt   Hyperlipidemia    takes Zocor nightly   Hypertension    takes Lisinopril daily   Inguinal hernia    Internal hemorrhoids    Lazy eye rt eye   Liver cyst    Neck pain    Neuropathy    Right hydrocele    Sleep  disturbance    takes Ambien prn   Ulcer    Past Surgical History:  Procedure Laterality Date   BACK SURGERY     bilateral cataract removed     CERVICAL DISC SURGERY     x 2   COLONOSCOPY     HEMORRHOID SURGERY     INGUINAL HERNIA REPAIR Right 03/21/2013   Procedure: HERNIA REPAIR INGUINAL ADULT;  Surgeon: Imogene Burn. Georgette Dover, MD;  Location: [place];  Service: General;  Laterality: Right;   INSERTION OF MESH Right 03/21/2013   Procedure: INSERTION OF MESH;  Surgeon: Imogene Burn. Georgette Dover, MD;  Location: [place];  Service: General;  Laterality: Right;   LUMBAR DISC SURGERY     x 2   NECK SURGERY       A IV Location/Drains/Wounds Patient Lines/Drains/Airways Status     Active Line/Drains/Airways     Name Placement date Placement time Site Days   Peripheral IV 03/15/22 Right Antecubital 03/15/22  --  Antecubital  less than 1            Intake/Output Last 24 hours  Intake/Output Summary (Last 24 hours) at 03/15/2022 2352 Last data filed at 03/15/2022 2306 Gross per 24 hour  Intake 1000 ml  Output --  Net 1000 ml    Labs/Imaging Results for  orders placed or performed during the hospital encounter of 03/15/22 (from the past 48 hour(s))  CBG monitoring, ED     Status: Abnormal   Collection Time: 03/15/22  6:43 PM  Result Value Ref Range   Glucose-Capillary 157 (H) 70 - 99 mg/dL    Comment: Glucose reference range applies only to samples taken after fasting for at least 8 hours.  CBC     Status: Abnormal   Collection Time: 03/15/22  6:59 PM  Result Value Ref Range   WBC 8.9 4.0 - 10.5 K/uL   RBC 3.69 (L) 4.22 - 5.81 MIL/uL   Hemoglobin 11.9 (L) 13.0 - 17.0 g/dL    Comment: REPEATED TO VERIFY   HCT 36.2 (L) 39.0 - 52.0 %   MCV 98.1 80.0 - 100.0 fL   MCH 32.2 26.0 - 34.0 pg   MCHC 32.9 30.0 - 36.0 g/dL   RDW 13.0 11.5 - 15.5 %   Platelets 130 (L) 150 - 400 K/uL   nRBC 0.0 0.0 - 0.2 %    Comment: Performed at [facility], [place] [address], [place], [place] [address]   Differential     Status: None   Collection Time: 03/15/22  6:59 PM  Result Value Ref Range   Neutrophils Relative % 70 %   Neutro Abs 6.1 1.7 - 7.7 K/uL   Lymphocytes Relative 22 %   Lymphs Abs 2.0 0.7 - 4.0 K/uL   Monocytes Relative 7 %   Monocytes Absolute 0.6 0.1 - 1.0 K/uL   Eosinophils Relative 1 %   Eosinophils Absolute 0.1 0.0 - 0.5 K/uL   Basophils Relative 0 %   Basophils Absolute 0.0 0.0 - 0.1 K/uL   Immature Granulocytes 0 %   Abs Immature Granulocytes 0.03 0.00 - 0.07 K/uL    Comment: Performed at [facility] [address], [place], [place] [address]  Ethanol     Status: None   Collection Time: 03/15/22  6:59 PM  Result Value Ref Range   Alcohol, Ethyl (B) <10 <10 mg/dL    Comment: (NOTE) Lowest detectable limit for serum alcohol is 10 mg/dL.  For medical purposes only. Performed at [facility], [place] [address], [place], [place] [address]   I-stat chem 8, ED     Status: Abnormal   Collection Time: 03/15/22  6:59 PM  Result Value Ref Range   Sodium 141 135 - 145 mmol/L   Potassium 3.5 3.5 - 5.1 mmol/L   Chloride 106 98 - 111 mmol/L   BUN 35 (H) 8 - 23 mg/dL   Creatinine, Ser 3.10 (H) 0.61 - 1.24 mg/dL   Glucose, Bld 138 (H) 70 - 99 mg/dL    Comment: Glucose reference range applies only to samples taken after fasting for at least 8 hours.   Calcium, Ion 1.06 (L) 1.15 - 1.40 mmol/L   TCO2 22 22 - 32 mmol/L   Hemoglobin 15.6 13.0 - 17.0 g/dL   HCT 46.0 39.0 - 52.0 %  Hemoglobin A1c     Status: None   Collection Time: 03/15/22  6:59 PM  Result Value Ref Range   Hgb A1c MFr Bld 5.3 4.8 - 5.6 %    Comment: (NOTE) Pre diabetes:          5.7%-6.4%  Diabetes:              >6.4%  Glycemic control for   <7.0% adults with diabetes    Mean Plasma Glucose  105.41 mg/dL    Comment: Performed at [facility], [place] [address], [place], [place] [address]  Comprehensive metabolic panel     Status: Abnormal   Collection Time: 03/15/22  7:40 PM   Result Value Ref Range   Sodium 137 135 - 145 mmol/L   Potassium 2.9 (L) 3.5 - 5.1 mmol/L   Chloride 101 98 - 111 mmol/L   CO2 24 22 - 32 mmol/L   Glucose, Bld 91 70 - 99 mg/dL    Comment: Glucose reference range applies only to samples taken after fasting for at least 8 hours.   BUN 32 (H) 8 - 23 mg/dL   Creatinine, Ser 2.87 (H) 0.61 - 1.24 mg/dL   Calcium 8.9 8.9 - 10.3 mg/dL   Total Protein 6.9 6.5 - 8.1 g/dL   Albumin 3.6 3.5 - 5.0 g/dL   AST 25 15 - 41 U/L   ALT 27 0 - 44 U/L   Alkaline Phosphatase 64 38 - 126 U/L   Total Bilirubin 1.0 0.3 - 1.2 mg/dL   GFR, Estimated 22 (L) >60 mL/min    Comment: (NOTE) Calculated using the CKD-EPI Creatinine Equation (2021)    Anion gap 12 5 - 15    Comment: Performed at [facility] [address], [place], [place] [address]   CT ANGIO HEAD NECK W WO CM (CODE STROKE)  Addendum Date: 03/15/2022   ADDENDUM REPORT: 03/15/2022 19:37 ADDENDUM: There is 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 Discussed with Dr. Curly Shores 7:36 p.m. on 03/15/2022. Electronically Signed   By: Ulyses Jarred M.D.   On: 03/15/2022 19:37   Result Date: 03/15/2022 CLINICAL DATA:  Bilateral weakness. EXAM: CT ANGIOGRAPHY HEAD AND NECK TECHNIQUE: Multidetector CT imaging of the head and neck was performed using the standard protocol during bolus administration of intravenous contrast. Multiplanar CT image reconstructions and MIPs were obtained to evaluate the vascular anatomy. Carotid stenosis measurements (when applicable) are obtained utilizing NASCET criteria, using the distal internal carotid diameter as the denominator. RADIATION DOSE REDUCTION: This exam was performed according to the departmental dose-optimization program which includes automated exposure control, adjustment of the mA and/or kV according to patient size and/or use of iterative reconstruction technique. CONTRAST:  75 mL Omnipaque  350 COMPARISON:  None Available. FINDINGS: CTA NECK FINDINGS SKELETON: There is no bony spinal canal stenosis. No lytic or blastic lesion. C5-7 ACDF. OTHER NECK: Normal pharynx, larynx and major salivary glands. No cervical lymphadenopathy. Unremarkable thyroid gland. UPPER CHEST: No pneumothorax or pleural effusion. No nodules or masses. AORTIC ARCH: There is calcific atherosclerosis of the aortic arch. There is no aneurysm, dissection or hemodynamically significant stenosis of the visualized portion of the aorta. Conventional 3 vessel aortic branching pattern. The visualized proximal subclavian arteries are widely patent. RIGHT CAROTID SYSTEM: No dissection, occlusion or aneurysm. Mild atherosclerotic calcification at the carotid bifurcation without hemodynamically significant stenosis. LEFT CAROTID SYSTEM: Normal without aneurysm, dissection or stenosis. VERTEBRAL ARTERIES: Left dominant configuration. Both origins are clearly patent. There is no dissection, occlusion or flow-limiting stenosis to the skull base (V1-V3 segments). CTA HEAD FINDINGS POSTERIOR CIRCULATION: --Vertebral arteries: Normal V4 segments. --Inferior cerebellar arteries: Normal. --Basilar artery: Normal. --Superior cerebellar arteries: Normal. --Posterior cerebral arteries (PCA): Normal. ANTERIOR CIRCULATION: --Intracranial internal carotid arteries: Normal. --Anterior cerebral arteries (ACA): Normal. Both A1 segments are present. Patent anterior communicating artery (a-comm). --Middle cerebral arteries (MCA): Normal. VENOUS SINUSES: As  permitted by contrast timing, patent. ANATOMIC VARIANTS: Fetal origin of the right posterior cerebral artery. Review of the MIP images confirms the above findings. IMPRESSION: 1. No emergent large vessel occlusion or hemodynamically significant stenosis of the head or neck. 2. Mild right carotid bifurcation atherosclerosis without hemodynamically significant stenosis. Aortic atherosclerosis (ICD10-I70.0).  Electronically Signed: By: Ulyses Jarred M.D. On: 03/15/2022 19:14   CT HEAD CODE STROKE WO CONTRAST  Result Date: 03/15/2022 CLINICAL DATA:  Code stroke.  Bilateral weakness EXAM: CT HEAD WITHOUT CONTRAST TECHNIQUE: Contiguous axial images were obtained from the base of the skull through the vertex without intravenous contrast. RADIATION DOSE REDUCTION: This exam was performed according to the departmental dose-optimization program which includes automated exposure control, adjustment of the mA and/or kV according to patient size and/or use of iterative reconstruction technique. COMPARISON:  None Available. FINDINGS: Brain: There is no mass, hemorrhage or extra-axial collection. The size and configuration of the ventricles and extra-axial CSF spaces are normal. The brain parenchyma is normal, without evidence of acute or chronic infarction. Vascular: No abnormal hyperdensity of the major intracranial arteries or dural venous sinuses. No intracranial atherosclerosis. Skull: The visualized skull base, calvarium and extracranial soft tissues are normal. Sinuses/Orbits: No fluid levels or advanced mucosal thickening of the visualized paranasal sinuses. No mastoid or middle ear effusion. The orbits are normal. ASPECTS [facility] Stroke Program Early CT Score) - Ganglionic level infarction (caudate, lentiform nuclei, internal capsule, insula, M1-M3 cortex): 7 - Supraganglionic infarction (M4-M6 cortex): 3 Total score (0-10 with 10 being normal): 10 IMPRESSION: 1. No acute intracranial abnormality. 2. ASPECTS is 10. These results were communicated to Dr. Lesleigh Noe at 7:04 pm on 03/15/2022 by text page via the [facility] messaging system. Electronically Signed   By: Ulyses Jarred M.D.   On: 03/15/2022 19:09    Pending Labs Unresulted Labs (From admission, onward)     Start     Ordered   03/16/22 0500  Lipid panel  (Labs)  Tomorrow morning,   R       Comments: Fasting    03/15/22 1924   03/16/22 0500  CBC  (Labs)   Tomorrow morning,   R        03/15/22 1924   03/16/22 0500  Comprehensive metabolic panel  (Labs)  Tomorrow morning,   R        03/15/22 1924   03/15/22 2000  Protime-INR  Once,   STAT        03/15/22 2000   03/15/22 2000  APTT  Once,   STAT        03/15/22 2000   03/15/22 1924  Urine rapid drug screen (hosp performed)not at [facility]  (Labs)  Once,   R        03/15/22 1924   03/15/22 1924  Urinalysis, Complete w Microscopic  (Labs)  Once,   R        03/15/22 1924            Vitals/Pain Today's Vitals   03/15/22 2301 03/15/22 2315 03/15/22 2330 03/15/22 2343  BP: 104/67  108/69   Pulse: (!) 54  65   Resp: 16  20   Temp:    98.1 F (36.7 C)  TempSrc:      SpO2: 99%  100%   Weight:      PainSc:  0-No pain      Isolation Precautions No active isolations  Medications Medications   stroke: early stages of recovery book (has no administration  in time range)  0.9 %  sodium chloride infusion ( Intravenous Rate/Dose Change 03/15/22 2306)  acetaminophen (TYLENOL) tablet 650 mg (has no administration in time range)    Or  acetaminophen (TYLENOL) 160 MG/5ML solution 650 mg (has no administration in time range)    Or  acetaminophen (TYLENOL) suppository 650 mg (has no administration in time range)  senna-docusate (Senokot-S) tablet 1 tablet (has no administration in time range)  pantoprazole (PROTONIX) injection 40 mg (40 mg Intravenous Given 03/15/22 2258)  labetalol (NORMODYNE) injection 5 mg (has no administration in time range)  gabapentin (NEURONTIN) capsule 800 mg (800 mg Oral Given 03/15/22 2342)  sodium chloride flush (NS) 0.9 % injection 3 mL (3 mLs Intravenous Given 03/15/22 1948)  iohexol (OMNIPAQUE) 350 MG/ML injection 75 mL (75 mLs Intravenous Contrast Given 03/15/22 1904)  tenecteplase (TNKASE) injection for Stroke 22 mg (22 mg Intravenous Given 03/15/22 1914)  sodium chloride 0.9 % bolus 1,000 mL (0 mLs Intravenous Stopped 03/15/22 2306)    Mobility walks with device at  home     Focused Assessments Neuro Assessment Handoff:  Swallow screen pass? Yes    NIH Stroke Scale ( + Modified Stroke Scale Criteria)  Interval: Initial Level of Consciousness (1a.)   : Alert, keenly responsive LOC Questions (1b. )   +: Answers both questions correctly LOC Commands (1c. )   + : Performs both tasks correctly Best Gaze (2. )  +: Normal Visual (3. )  +: No visual loss Facial Palsy (4. )    : Normal symmetrical movements Motor Arm, Left (5a. )   +: Drift Motor Arm, Right (5b. )   +: No drift Motor Leg, Left (6a. )   +: No drift Motor Leg, Right (6b. )   +: No drift Limb Ataxia (7. ): Absent Sensory (8. )   +: Normal, no sensory loss Best Language (9. )   +: No aphasia Dysarthria (10. ): Normal Extinction/Inattention (11.)   +: No Abnormality Modified SS Total  +: 1 Complete NIHSS TOTAL: 1 Last date known well: 03/15/22 Last time known well: 1500 Neuro Assessment:   Neuro Checks:   Initial (03/15/22 1900)  Last Documented NIHSS Modified Score: 1 (03/15/22 2230) Has TPA been given? Yes Temp: 98.1 F (36.7 C) (10/09 2343) Temp Source: Oral (10/09 1942) BP: 108/69 (10/09 2330) Pulse Rate: 65 (10/09 2330) If patient is a Neuro Trauma and patient is going to OR before floor call report to [place] nurse: [phone] or [phone]   R Recommendations: See Admitting Provider Note  Report given to:   Additional Notes:

## 2022-03-16 ENCOUNTER — Inpatient Hospital Stay (HOSPITAL_COMMUNITY): Payer: Medicare Other

## 2022-03-16 DIAGNOSIS — I6389 Other cerebral infarction: Secondary | ICD-10-CM | POA: Diagnosis not present

## 2022-03-16 DIAGNOSIS — R4701 Aphasia: Secondary | ICD-10-CM

## 2022-03-16 DIAGNOSIS — I639 Cerebral infarction, unspecified: Secondary | ICD-10-CM | POA: Diagnosis not present

## 2022-03-16 LAB — COMPREHENSIVE METABOLIC PANEL
ALT: 26 U/L (ref 0–44)
AST: 26 U/L (ref 15–41)
Albumin: 3.5 g/dL (ref 3.5–5.0)
Alkaline Phosphatase: 64 U/L (ref 38–126)
Anion gap: 12 (ref 5–15)
BUN: 29 mg/dL — ABNORMAL HIGH (ref 8–23)
CO2: 22 mmol/L (ref 22–32)
Calcium: 8.4 mg/dL — ABNORMAL LOW (ref 8.9–10.3)
Chloride: 106 mmol/L (ref 98–111)
Creatinine, Ser: 2.19 mg/dL — ABNORMAL HIGH (ref 0.61–1.24)
GFR, Estimated: 30 mL/min — ABNORMAL LOW (ref >60)
Glucose, Bld: 87 mg/dL (ref 70–99)
Potassium: 3 mmol/L — ABNORMAL LOW (ref 3.5–5.1)
Sodium: 140 mmol/L (ref 135–145)
Total Bilirubin: 1.4 mg/dL — ABNORMAL HIGH (ref 0.3–1.2)
Total Protein: 6.7 g/dL (ref 6.5–8.1)

## 2022-03-16 LAB — URINALYSIS, COMPLETE (UACMP) WITH MICROSCOPIC
Bacteria, UA: NONE SEEN
Bilirubin Urine: NEGATIVE
Glucose, UA: NEGATIVE mg/dL
Ketones, ur: NEGATIVE mg/dL
Leukocytes,Ua: NEGATIVE
Nitrite: NEGATIVE
Protein, ur: 30 mg/dL — AB
Specific Gravity, Urine: 1.042 — ABNORMAL HIGH (ref 1.005–1.030)
pH: 5 (ref 5.0–8.0)

## 2022-03-16 LAB — ECHOCARDIOGRAM COMPLETE
AR max vel: 3.45 cm2
AV Area VTI: 3.39 cm2
AV Area mean vel: 3.39 cm2
AV Mean grad: 3 mmHg
AV Peak grad: 5.9 mmHg
Ao pk vel: 1.21 m/s
Area-P 1/2: 2.54 cm2
S' Lateral: 3 cm
Weight: 3167.57 oz

## 2022-03-16 LAB — CBC
HCT: 41.1 % (ref 39.0–52.0)
Hemoglobin: 14 g/dL (ref 13.0–17.0)
MCH: 31.5 pg (ref 26.0–34.0)
MCHC: 34.1 g/dL (ref 30.0–36.0)
MCV: 92.4 fL (ref 80.0–100.0)
Platelets: 69 10*3/uL — ABNORMAL LOW (ref 150–400)
RBC: 4.45 MIL/uL (ref 4.22–5.81)
RDW: 12.9 % (ref 11.5–15.5)
WBC: 6.9 10*3/uL (ref 4.0–10.5)
nRBC: 0 % (ref 0.0–0.2)

## 2022-03-16 LAB — LIPID PANEL
Cholesterol: 161 mg/dL (ref 0–200)
HDL: 41 mg/dL (ref >40)
LDL Cholesterol: 100 mg/dL — ABNORMAL HIGH (ref 0–99)
Total CHOL/HDL Ratio: 3.9 RATIO
Triglycerides: 99 mg/dL (ref <150)
VLDL: 20 mg/dL (ref 0–40)

## 2022-03-16 LAB — APTT: aPTT: 20 seconds — ABNORMAL LOW (ref 24–36)

## 2022-03-16 LAB — RAPID URINE DRUG SCREEN, HOSP PERFORMED
Amphetamines: NOT DETECTED
Barbiturates: NOT DETECTED
Benzodiazepines: NOT DETECTED
Cocaine: NOT DETECTED
Opiates: NOT DETECTED
Tetrahydrocannabinol: NOT DETECTED

## 2022-03-16 LAB — PROTIME-INR
INR: 1.1 (ref 0.8–1.2)
Prothrombin Time: 13.6 seconds (ref 11.4–15.2)

## 2022-03-16 LAB — MRSA NEXT GEN BY PCR, NASAL: MRSA by PCR Next Gen: NOT DETECTED

## 2022-03-16 MED ORDER — ASPIRIN 300 MG RE SUPP
300.0000 mg | Freq: Every day | RECTAL | Status: DC
  Filled 2022-03-16 (×3): qty 1

## 2022-03-16 MED ORDER — ENOXAPARIN SODIUM 40 MG/0.4ML IJ SOSY
40.0000 mg | PREFILLED_SYRINGE | Freq: Every day | INTRAMUSCULAR | Status: DC
  Administered 2022-03-16 – 2022-03-17 (×2): 40 mg via SUBCUTANEOUS
  Filled 2022-03-16 (×2): qty 0.4

## 2022-03-16 MED ORDER — ORAL CARE MOUTH RINSE: 15.0000 mL | OROMUCOSAL | Status: DC | PRN

## 2022-03-16 MED ORDER — PANTOPRAZOLE SODIUM 40 MG PO TBEC
40.0000 mg | DELAYED_RELEASE_TABLET | Freq: Every day | ORAL | Status: DC
  Administered 2022-03-16 – 2022-03-17 (×2): 40 mg via ORAL
  Filled 2022-03-16 (×2): qty 1

## 2022-03-16 MED ORDER — ASPIRIN 81 MG PO CHEW
81.0000 mg | CHEWABLE_TABLET | Freq: Every day | ORAL | Status: DC
  Administered 2022-03-16 – 2022-03-18 (×3): 81 mg via ORAL
  Filled 2022-03-16 (×3): qty 1

## 2022-03-16 MED ORDER — SODIUM CHLORIDE 0.9 % IV BOLUS
500.0000 mL | Freq: Once | INTRAVENOUS | Status: AC
  Administered 2022-03-16: 500 mL via INTRAVENOUS

## 2022-03-16 MED ORDER — POTASSIUM CHLORIDE CRYS ER 20 MEQ PO TBCR
40.0000 meq | EXTENDED_RELEASE_TABLET | ORAL | Status: AC
  Administered 2022-03-16 (×2): 40 meq via ORAL
  Filled 2022-03-16 (×2): qty 2

## 2022-03-16 MED ORDER — CHLORHEXIDINE GLUCONATE CLOTH 2 % EX PADS
6.0000 | MEDICATED_PAD | Freq: Every day | CUTANEOUS | Status: DC
  Administered 2022-03-16: 6 via TOPICAL

## 2022-03-16 NOTE — Evaluation (Signed)
Speech Language Pathology Evaluation Patient Details Name: Kurt Parsons MRN: [id number] DOB: 1946-05-28 Today's Date: 03/16/2022 Time: 1120-1200 SLP Time Calculation (min) (ACUTE ONLY): 40 min  Problem List:  Patient Active Problem List   Diagnosis Date Noted   Stroke ([facility]) 03/15/2022   Viral URI with cough 04/29/2014   Right inguinal hernia 02/23/2013   Abdominal pain, chronic, right lower quadrant 10/17/2012   Rectal fissure 10/02/2012   Unspecified constipation 10/02/2012   Abnormal TSH 07/10/2012   Insomnia 07/08/2011   GERD (gastroesophageal reflux disease) 07/08/2011   OTHER SPECIFIED IDIOPATHIC PERIPHERAL NEUROPATHY 06/10/2009   Diverticulosis of colon (without mention of hemorrhage) 10/26/2007   MIXED HYPERLIPIDEMIA 05/01/2007   ERECTILE DYSFUNCTION, MILD 05/01/2007   BACK PAIN, CHRONIC 05/01/2007   Essential hypertension 03/07/2007   Past Medical History:  Past Medical History:  Diagnosis Date   Back pain    Chronic pain syndrome    takes Morphine daily   Constipation    takes Colace daily   Dizziness    occasionally   ED (erectile dysfunction)    takes Celexa daily   Gallbladder sludge    GERD (gastroesophageal reflux disease)    takes Omeprazole daily   Glaucoma    mild per pt   Hyperlipidemia    takes Zocor nightly   Hypertension    takes Lisinopril daily   Inguinal hernia    Internal hemorrhoids    Lazy eye rt eye   Liver cyst    Neck pain    Neuropathy    Right hydrocele    Sleep disturbance    takes Ambien prn   Ulcer    Past Surgical History:  Past Surgical History:  Procedure Laterality Date   BACK SURGERY     bilateral cataract removed     CERVICAL DISC SURGERY     x 2   COLONOSCOPY     HEMORRHOID SURGERY     INGUINAL HERNIA REPAIR Right 03/21/2013   Procedure: HERNIA REPAIR INGUINAL ADULT;  Surgeon: Imogene Burn. Georgette Dover, MD;  Location: [place];  Service: General;  Laterality: Right;   INSERTION OF MESH Right 03/21/2013   Procedure:  INSERTION OF MESH;  Surgeon: Imogene Burn. Georgette Dover, MD;  Location: [place];  Service: General;  Laterality: Right;   LUMBAR DISC SURGERY     x 2   NECK SURGERY     HPI:  76yo male admitted 03/16/22 with right side weakness and speech disturbance. PMH: HTN, HLD, obesity, nonischemic cell lung cancer s/p resection (03/2021), C5-C6 posterior cervical decompression (1994), C5-C7 ACDF (1995, 1996), back pain, chronic pain syndrome, constipation, dizziness, ED, gallbladder sludge, GERD, glaucoma, inguinal hernia, neuropathy, sleep disturbance. Acute onset aphasia. MRI pending   Assessment / Plan / Recommendation Clinical Impression  Pt seen at bedside for cognitive linguistic evaluation. Pt was awake and alert, seated upright in recliner. Daughter was present, and was very helpful throughout evaluation. Dtr reports pt's first language was Arabic, then [place], then [place]. Prior to admit, dtr reports pt was fully independent.   Pt exhibited difficulty switching topics during this assessment, and frequently went back and forth between [place] and [place] words. In addition, pt is quite hard of hearing, requiring repetition with increased volume frequently. Receptive Language Deficits characterized by errors in Yes/No questions. Pt tended to be yes dominant, regardless of whether his daughter or I was asking the questions. Pt was able to follow one-step directions accurately with frequent repetition of the command. 2-step commands were largely inaccurate, even  with repetition. Reading comprehension was not assessed at this time.   Expressive language deficits characterized by minimal difficulty with automatic sequences, difficult repetition of 1- and 2-syllable words, and inability to answer responsive naming tasks. Pt was able to name objects with 90% accuracy, but used both [place] and [place] to name items in the room. Casual social exchanges are largely intact. Written expression not assessed at this time.   Pt  would benefit from continued skilled ST intervention to maximize communicative efficiency. He may need continued ST intervention after acute stay.    SLP Assessment  SLP Recommendation/Assessment: Patient needs continued Speech Language Pathology Services  SLP Visit Diagnosis: Aphasia (R47.01)    Recommendations for follow up therapy are one component of a multi-disciplinary discharge planning process, led by the attending physician.  Recommendations may be updated based on patient status, additional functional criteria and insurance authorization.    Follow Up Recommendations  TBD   Assistance Recommended at Discharge  TBD  Functional Status Assessment Patient has had a recent decline in their functional status and demonstrates the ability to make significant improvements in function in a reasonable and predictable amount of time.   Frequency and Duration min 1 x/week  2 weeks      SLP Evaluation Cognition  Overall Cognitive Status: Difficult to assess Arousal/Alertness: Awake/alert       Comprehension  Auditory Comprehension Overall Auditory Comprehension: Impaired Yes/No Questions: Impaired (yes dominant) Basic Biographical Questions: 26-50% accurate Basic Immediate Environment Questions: 25-49% accurate Commands: Impaired One Step Basic Commands: 75-100% accurate Two Step Basic Commands: 25-49% accurate Conversation: Simple Interfering Components: Hearing EffectiveTechniques: Increased volume;Repetition;Slowed speech;Visual/Gestural cues Visual Recognition/Discrimination Discrimination: Not tested Reading Comprehension Reading Status: Not tested    Expression Expression Primary Mode of Expression: Verbal Verbal Expression Overall Verbal Expression: Impaired Initiation: No impairment Automatic Speech: Name;Social Response;Day of week;Month of year (Name and social exchanges intact. Min difficulty with MOY and DOW) Level of Generative/Spontaneous Verbalization:  Phrase;Sentence;Word Repetition: Impaired Level of Impairment: Word level Naming: Impairment Responsive: 0-25% accurate Confrontation: Within functional limits Verbal Errors: Neologisms;Perseveration Written Expression Dominant Hand: Right Written Expression: Not tested   Oral / Motor  Oral Motor/Sensory Function Overall Oral Motor/Sensory Function: Within functional limits Motor Speech Overall Motor Speech: Appears within functional limits for tasks assessed Respiration: Within functional limits Phonation: Normal Resonance: Within functional limits Articulation: Within functional limitis Intelligibility: Intelligible Motor Planning: Witnin functional limits Interfering Components: Hearing loss           Add Dinapoli B. Quentin Ore, [facility], [place] Speech Language Pathologist Office: [phone]  Shonna Chock 03/16/2022, 12:29 PM

## 2022-03-16 NOTE — Progress Notes (Signed)
Brief Neuro Update:  Patient is more than 24 hours post tnkase with MRI demonstrating small L caudate and multiple small L parietal lobe strokes. No hemorrhage.  Will transfer out of ICU.  Recs: - Start Aspirin '81mg'$  daily and DVT prophylaxis.  [place] Pager Number [phone]

## 2022-03-16 NOTE — Progress Notes (Signed)
OT Cancellation Note  Patient Details Name: Kurt Parsons MRN: [id number] DOB: [birth year]   Cancelled Treatment:    Reason Eval/Treat Not Completed: Active bedrest order OT order received and appreciated however this conflicts with current bedrest order set. Please increase activity tolerance as appropriate and remove bedrest from orders. . Please contact OT at [phone] if bed rest order is discontinued. OT will hold evaluation at this time and will check back as time allows pending increased activity orders.   Jeri Modena 03/16/2022, 8:01 AM

## 2022-03-16 NOTE — Progress Notes (Signed)
PT Cancellation Note  Patient Details Name: Kurt Parsons MRN: [id number] DOB: [birth year]   Cancelled Treatment:    Reason Eval/Treat Not Completed: Active bedrest order  Pt with strict bedrest orders.  Received TNK around 19:11 last night, plan to hold PT for 24 hrs unless bedrest updated.  Abran Richard, PT Acute Rehab [facility] [phone]  Karlton Lemon 03/16/2022, 9:58 AM

## 2022-03-16 NOTE — Progress Notes (Addendum)
STROKE TEAM PROGRESS NOTE   INTERVAL HISTORY His daughter is at the bedside. She is a physician  @ [place]  who is providing his medical history.Patient  @ this time has some improvement of his speech. He is able to remember some of his events. Stated that he was sitting in a chair and then had difficulty getting  out of the chair . Once he was up, he then grabbed a glass which he dropped and tried to call out for his wife. He has no recollection of the ED or admission Patients daughter has been with him through the night and reporting he has much improved with his speech and thoughts. She did point to [place] that last night he was communicating in his first & second language (Arabic/French) and just started speaking english early am. Patient work up including [place] revealed no acute intracranial abnormality (Aspect Score 10) CTA Head/Neck  revealed No LVO . 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  Patient neurological examination has improved post TNK. He still has expressive aphasia and receptive language deficits which makes him frustrated and @ one point crying  He denies any headaches, swallowing difficulty, visual changes, paresthesia or weakness Blood  pressure adequately controlled.  Follow-up brain  imaging post TNK standing  Vitals:   03/16/22 1115 03/16/22 1200 03/16/22 1300 03/16/22 1400  BP:  99/71 113/67 109/65  Pulse:  61 (!) 59 61  Resp:  '14 18 16  '$ Temp: 97.7 F (36.5 C)     TempSrc: Oral     SpO2:  98% 97% 95%  Weight:       CBC:  Recent Labs  Lab 03/15/22 1859 03/16/22 0224  WBC 8.9 6.9  NEUTROABS 6.1  --   HGB 11.9*  15.6 14.0  HCT 36.2*  46.0 41.1  MCV 98.1 92.4  PLT 130* 69*   Basic Metabolic Panel:  Recent Labs  Lab 03/15/22 1940 03/16/22 0224  NA 137 140  K 2.9* 3.0*  CL 101 106  CO2 24 22  GLUCOSE 91 87  BUN 32* 29*  CREATININE 2.87* 2.19*  CALCIUM 8.9 8.4*   Lipid Panel:  Recent Labs  Lab  03/16/22 0224  CHOL 161  TRIG 99  HDL 41  CHOLHDL 3.9  VLDL 20  LDLCALC 100*   HgbA1c:  Recent Labs  Lab 03/15/22 1859  HGBA1C 5.3   Urine Drug Screen:  Recent Labs  Lab 03/16/22 0320  LABOPIA NONE DETECTED  COCAINSCRNUR NONE DETECTED  LABBENZ NONE DETECTED  AMPHETMU NONE DETECTED  THCU NONE DETECTED  LABBARB NONE DETECTED    Alcohol Level  Recent Labs  Lab 03/15/22 1859  ETH <10    IMAGING past 24 hours ECHOCARDIOGRAM COMPLETE  Result Date: 03/16/2022    ECHOCARDIOGRAM REPORT   Patient Name:   Kurt Parsons Date of Exam: 03/16/2022 Medical Rec #:  [id number]        Height:       70.0 in Accession #:    [id number]       Weight:       198.0 lb Date of Birth:  Jul 04, 1945         BSA:          2.078 m Patient Age:    16 years         BP:           101/55 mmHg Patient Gender: M  HR:           56 bpm. Exam Location:  Inpatient Procedure: 2D Echo, Cardiac Doppler and Color Doppler Indications:    Stroke  History:        Patient has no prior history of Echocardiogram examinations.                 Risk Factors:Dyslipidemia and Hypertension.  Sonographer:    Memory Argue Referring Phys: [id number] [place]  1. Left ventricular ejection fraction, by estimation, is 60 to 65%. The left ventricle has normal function. The left ventricle has no regional wall motion abnormalities. There is mild concentric left ventricular hypertrophy. Left ventricular diastolic parameters are consistent with Grade I diastolic dysfunction (impaired relaxation).  2. Right ventricular systolic function is normal. The right ventricular size is normal. There is normal pulmonary artery systolic pressure.  3. The mitral valve is normal in structure. No evidence of mitral valve regurgitation. No evidence of mitral stenosis.  4. The aortic valve was not well visualized. Aortic valve regurgitation is not visualized. No aortic stenosis is present.  5. Aortic dilatation noted. There is mild  dilatation of the aortic root, measuring 40 mm.  6. The inferior vena cava is normal in size with greater than 50% respiratory variability, suggesting right atrial pressure of 3 mmHg. Comparison(s): No prior Echocardiogram. Conclusion(s)/Recommendation(s): Normal biventricular function without evidence of hemodynamically significant valvular heart disease. FINDINGS  Left Ventricle: Left ventricular ejection fraction, by estimation, is 60 to 65%. The left ventricle has normal function. The left ventricle has no regional wall motion abnormalities. The left ventricular internal cavity size was normal in size. There is  mild concentric left ventricular hypertrophy. Left ventricular diastolic parameters are consistent with Grade I diastolic dysfunction (impaired relaxation). Right Ventricle: The right ventricular size is normal. No increase in right ventricular wall thickness. Right ventricular systolic function is normal. There is normal pulmonary artery systolic pressure. The tricuspid regurgitant velocity is 1.86 m/s, and  with an assumed right atrial pressure of 3 mmHg, the estimated right ventricular systolic pressure is 38.4 mmHg. Left Atrium: Left atrial size was normal in size. Right Atrium: Right atrial size was normal in size. Pericardium: There is no evidence of pericardial effusion. Mitral Valve: The mitral valve is normal in structure. No evidence of mitral valve regurgitation. No evidence of mitral valve stenosis. Tricuspid Valve: The tricuspid valve is normal in structure. Tricuspid valve regurgitation is not demonstrated. No evidence of tricuspid stenosis. Aortic Valve: The aortic valve was not well visualized. Aortic valve regurgitation is not visualized. No aortic stenosis is present. Aortic valve mean gradient measures 3.0 mmHg. Aortic valve peak gradient measures 5.9 mmHg. Aortic valve area, by VTI measures 3.39 cm. Pulmonic Valve: The pulmonic valve was normal in structure. Pulmonic valve  regurgitation is not visualized. No evidence of pulmonic stenosis. Aorta: Aortic dilatation noted. There is mild dilatation of the aortic root, measuring 40 mm. Venous: The inferior vena cava is normal in size with greater than 50% respiratory variability, suggesting right atrial pressure of 3 mmHg. IAS/Shunts: No atrial level shunt detected by color flow Doppler.  LEFT VENTRICLE PLAX 2D LVIDd:         4.30 cm   Diastology LVIDs:         3.00 cm   LV e' medial:    5.75 cm/s LV PW:         1.10 cm   LV E/e' medial:  9.1 LV IVS:  1.10 cm   LV e' lateral:   9.79 cm/s LVOT diam:     2.40 cm   LV E/e' lateral: 5.3 LV SV:         87 LV SV Index:   42 LVOT Area:     4.52 cm  RIGHT VENTRICLE TAPSE (M-mode): 1.7 cm LEFT ATRIUM           Index        RIGHT ATRIUM           Index LA diam:      2.70 cm 1.30 cm/m   RA Area:     13.40 cm LA Vol (A2C): 54.5 ml 26.22 ml/m  RA Volume:   32.00 ml  15.40 ml/m LA Vol (A4C): 64.3 ml 30.94 ml/m  AORTIC VALVE AV Area (Vmax):    3.45 cm AV Area (Vmean):   3.39 cm AV Area (VTI):     3.39 cm AV Vmax:           121.00 cm/s AV Vmean:          76.000 cm/s AV VTI:            0.256 m AV Peak Grad:      5.9 mmHg AV Mean Grad:      3.0 mmHg LVOT Vmax:         92.40 cm/s LVOT Vmean:        56.900 cm/s LVOT VTI:          0.192 m LVOT/AV VTI ratio: 0.75  AORTA Ao Root diam: 4.00 cm MITRAL VALVE               TRICUSPID VALVE MV Area (PHT): 2.54 cm    TR Peak grad:   13.8 mmHg MV Decel Time: 299 msec    TR Vmax:        186.00 cm/s MV E velocity: 52.10 cm/s MV A velocity: 75.10 cm/s  SHUNTS MV E/A ratio:  0.69        Systemic VTI:  0.19 m                            Systemic Diam: 2.40 cm Rudean Haskell MD Electronically signed by Rudean Haskell MD Signature Date/Time: 03/16/2022/9:01:43 AM    Final    CT ANGIO HEAD NECK W WO CM (CODE STROKE)  Addendum Date: 03/15/2022   ADDENDUM REPORT: 03/15/2022 19:37 ADDENDUM: There is multifocal severe stenosis and short segment  occlusion of the left MCA M2 and M3 branches, (series 10 image 19). Discussed with Dr. Curly Shores 7:36 p.m. on 03/15/2022. Electronically Signed   By: Ulyses Jarred M.D.   On: 03/15/2022 19:37   Result Date: 03/15/2022 CLINICAL DATA:  Bilateral weakness. EXAM: CT ANGIOGRAPHY HEAD AND NECK TECHNIQUE: Multidetector CT imaging of the head and neck was performed using the standard protocol during bolus administration of intravenous contrast. Multiplanar CT image reconstructions and MIPs were obtained to evaluate the vascular anatomy. Carotid stenosis measurements (when applicable) are obtained utilizing NASCET criteria, using the distal internal carotid diameter as the denominator. RADIATION DOSE REDUCTION: This exam was performed according to the departmental dose-optimization program which includes automated exposure control, adjustment of the mA and/or kV according to patient size and/or use of iterative reconstruction technique. CONTRAST:  75 mL Omnipaque 350 COMPARISON:  None Available. FINDINGS: CTA NECK FINDINGS SKELETON: There is no bony spinal canal stenosis. No lytic or blastic lesion. C5-7 ACDF. OTHER  NECK: Normal pharynx, larynx and major salivary glands. No cervical lymphadenopathy. Unremarkable thyroid gland. UPPER CHEST: No pneumothorax or pleural effusion. No nodules or masses. AORTIC ARCH: There is calcific atherosclerosis of the aortic arch. There is no aneurysm, dissection or hemodynamically significant stenosis of the visualized portion of the aorta. Conventional 3 vessel aortic branching pattern. The visualized proximal subclavian arteries are widely patent. RIGHT CAROTID SYSTEM: No dissection, occlusion or aneurysm. Mild atherosclerotic calcification at the carotid bifurcation without hemodynamically significant stenosis. LEFT CAROTID SYSTEM: Normal without aneurysm, dissection or stenosis. VERTEBRAL ARTERIES: Left dominant configuration. Both origins are clearly patent. There is no dissection,  occlusion or flow-limiting stenosis to the skull base (V1-V3 segments). CTA HEAD FINDINGS POSTERIOR CIRCULATION: --Vertebral arteries: Normal V4 segments. --Inferior cerebellar arteries: Normal. --Basilar artery: Normal. --Superior cerebellar arteries: Normal. --Posterior cerebral arteries (PCA): Normal. ANTERIOR CIRCULATION: --Intracranial internal carotid arteries: Normal. --Anterior cerebral arteries (ACA): Normal. Both A1 segments are present. Patent anterior communicating artery (a-comm). --Middle cerebral arteries (MCA): Normal. VENOUS SINUSES: As permitted by contrast timing, patent. ANATOMIC VARIANTS: Fetal origin of the right posterior cerebral artery. Review of the MIP images confirms the above findings. IMPRESSION: 1. No emergent large vessel occlusion or hemodynamically significant stenosis of the head or neck. 2. Mild right carotid bifurcation atherosclerosis without hemodynamically significant stenosis. Aortic atherosclerosis (ICD10-I70.0). Electronically Signed: By: Ulyses Jarred M.D. On: 03/15/2022 19:14   CT HEAD CODE STROKE WO CONTRAST  Result Date: 03/15/2022 CLINICAL DATA:  Code stroke.  Bilateral weakness EXAM: CT HEAD WITHOUT CONTRAST TECHNIQUE: Contiguous axial images were obtained from the base of the skull through the vertex without intravenous contrast. RADIATION DOSE REDUCTION: This exam was performed according to the departmental dose-optimization program which includes automated exposure control, adjustment of the mA and/or kV according to patient size and/or use of iterative reconstruction technique. COMPARISON:  None Available. FINDINGS: Brain: There is no mass, hemorrhage or extra-axial collection. The size and configuration of the ventricles and extra-axial CSF spaces are normal. The brain parenchyma is normal, without evidence of acute or chronic infarction. Vascular: No abnormal hyperdensity of the major intracranial arteries or dural venous sinuses. No intracranial  atherosclerosis. Skull: The visualized skull base, calvarium and extracranial soft tissues are normal. Sinuses/Orbits: No fluid levels or advanced mucosal thickening of the visualized paranasal sinuses. No mastoid or middle ear effusion. The orbits are normal. ASPECTS [facility] Stroke Program Early CT Score) - Ganglionic level infarction (caudate, lentiform nuclei, internal capsule, insula, M1-M3 cortex): 7 - Supraganglionic infarction (M4-M6 cortex): 3 Total score (0-10 with 10 being normal): 10 IMPRESSION: 1. No acute intracranial abnormality. 2. ASPECTS is 10. These results were communicated to Dr. Lesleigh Noe at 7:04 pm on 03/15/2022 by text page via the [facility] messaging system. Electronically Signed   By: Ulyses Jarred M.D.   On: 03/15/2022 19:09    PHYSICAL EXAM GENERAL: well nourished, well developed male, cooperative and NAD Psych: Mood is stable HEENT: - Normocephalic and atraumatic, LUNGS - Clear to auscultation bilaterally with no wheezes CV - S1S2 RRR, no m/r/g, equal pulses bilaterally. ABDOMEN - Soft, nontender, nondistended with normoactive BS Ext: warm, well perfused, intact peripheral pulses, no edema  NEURO:  Patient sitting in bed with daughter @ his side and @ times referring questions to her Mental Status: AA&Ox3 , follows commands , he is able to recite his age,dob, [place] [facility] and city [facility])  Language: speech with expressive deficits such as calling objects in primary language or @ times just responding yes/no.  He can follow one step direction and cannot completed complex  commands. He can name objects shown to him and most objects were correct. Naming, repetition, fluency, and comprehension intact. Cranial Nerves: PERRL 3 mm  EOMI, visual fields full, no facial asymmetry, facial sensation intact, hearing intact, tongue/uvula/soft palate midline, normal sternocleidomastoid and trapezius muscle strength. No evidence of tongue atrophy or fibrillations Motor: Moves  all extremity through out  RUE 5/5     RLE 5/5     R Grip 5/5 LUE  5-/5    LLE 5-/5    L Grip 4/5  Tone: is normal and bulk is normal Sensation- Intact to light touch bilaterally Coordination: FTN intact bilaterally, no ataxia in BLE. Gait- deferred  ASSESSMENT/PLAN Mr. VERYL WINEMILLER is a 76 y.o. male with history of  hypertensin, obesity, hyperlipidemia non small cell lung cancer (resected 03/2021)presenting with acute onset aphasia concerning for stroke with etiology to be determined after MRI Brain is completed . Patient received TNK with improvement of his aphasia..CTA Imaging also showed multifocal severe stenosis and short segment occlusion of l left MCA and branches. Patient also reported having  hypotension and with  known severe stenosis in the left MCA territory, he may have had symptomatic hypoperfusion versus atheroembolic   Stroke: Left MCA Territory Atheroembolic Etiology:    Code Stroke :CT head No acute abnormality. Frequent Neuro Checks Hold antiplatelet agent for 24 hours until MRI Brain is completed and confirms no significant hemorrhagic conversion MRI  Brain scheduled for this afternoon  Risk factor modifications Telemtry monitoring for PAF . If none documented then he will need 30 day event monitor vs Loop Recorder Post TNK BP Parameters next 24 hours <180/105 PT/OT/SLT Consultation for discharge planing HgbA1c is 5.3% @ goal LDL @ 100-              - PT consult, OT consult, Speech consult, unless patient is back to baseline LDL 100- continue zocor 20 mg qhs (Home Meds) HgbA1c 5.3   Recent Labs    03/15/22 1843  GLUCAP 157*      Other Stroke Risk Factors Advanced Age >/= 65   Obesity, Body mass index is 28.41 kg/m., BMI >/= 30 associated with increased stroke risk, recommend weight loss, diet and exercise as appropriate   Hypertension Hyperlipidemia   Hospital day # 1  I have personally obtained history,examined this patient, reviewed notes,  independently viewed imaging studies, participated in medical decision making and plan of care.ROS completed by me personally and pertinent positives fully documented  I have made any additions or clarifications directly to the above note. Agree with note above.  Patient developed sudden onset of confusion and aphasia yesterday possibly left hemispheric infarct and was treated with IV TNK and has shown improvement but still has some mild expressive aphasia..  Continue close neurological observation and strict blood pressure control as per post TNK protocol.  Mobilize out of bed.  Therapy consults.  Continue ongoing stroke work-up.  Long discussion with patient and daughter at the bedside and answered questions.This patient is critically ill and at significant risk of neurological worsening, death and care requires constant monitoring of vital signs, hemodynamics,respiratory and cardiac monitoring, extensive review of multiple databases, frequent neurological assessment, discussion with family, other specialists and medical decision making of high complexity.I have made any additions or clarifications directly to the above note.This critical care time does not reflect procedure time, or teaching time or supervisory time of PA/NP/Med Resident etc but could  involve care discussion time.  I spent 30 minutes of neurocritical care time  in the care of  this patient.      Antony Contras, MD Medical Director [facility] Stroke Center Pager: [phone] 03/16/2022 4:38 PM   To contact Stroke Continuity provider, please refer to [email or web address] After hours, contact General Neurology

## 2022-03-17 ENCOUNTER — Inpatient Hospital Stay (HOSPITAL_COMMUNITY): Payer: Medicare Other

## 2022-03-17 DIAGNOSIS — I639 Cerebral infarction, unspecified: Secondary | ICD-10-CM

## 2022-03-17 LAB — CBC
HCT: 35.7 % — ABNORMAL LOW (ref 39.0–52.0)
Hemoglobin: 12.2 g/dL — ABNORMAL LOW (ref 13.0–17.0)
MCH: 31.4 pg (ref 26.0–34.0)
MCHC: 34.2 g/dL (ref 30.0–36.0)
MCV: 92 fL (ref 80.0–100.0)
Platelets: 138 10*3/uL — ABNORMAL LOW (ref 150–400)
RBC: 3.88 MIL/uL — ABNORMAL LOW (ref 4.22–5.81)
RDW: 13 % (ref 11.5–15.5)
WBC: 7.2 10*3/uL (ref 4.0–10.5)
nRBC: 0 % (ref 0.0–0.2)

## 2022-03-17 LAB — BASIC METABOLIC PANEL
Anion gap: 9 (ref 5–15)
BUN: 18 mg/dL (ref 8–23)
CO2: 22 mmol/L (ref 22–32)
Calcium: 8.5 mg/dL — ABNORMAL LOW (ref 8.9–10.3)
Chloride: 113 mmol/L — ABNORMAL HIGH (ref 98–111)
Creatinine, Ser: 1.21 mg/dL (ref 0.61–1.24)
GFR, Estimated: >60 mL/min (ref >60)
Glucose, Bld: 96 mg/dL (ref 70–99)
Potassium: 3.9 mmol/L (ref 3.5–5.1)
Sodium: 144 mmol/L (ref 135–145)

## 2022-03-17 LAB — MAGNESIUM: Magnesium: 1.7 mg/dL (ref 1.7–2.4)

## 2022-03-17 MED ORDER — CLOPIDOGREL BISULFATE 75 MG PO TABS
75.0000 mg | ORAL_TABLET | Freq: Every day | ORAL | Status: DC
  Administered 2022-03-17 – 2022-03-18 (×2): 75 mg via ORAL
  Filled 2022-03-17 (×2): qty 1

## 2022-03-17 MED ORDER — GABAPENTIN 400 MG PO CAPS
800.0000 mg | ORAL_CAPSULE | Freq: Three times a day (TID) | ORAL | Status: DC
  Administered 2022-03-17 – 2022-03-18 (×4): 800 mg via ORAL
  Filled 2022-03-17 (×3): qty 2

## 2022-03-17 MED ORDER — SIMVASTATIN 20 MG PO TABS
40.0000 mg | ORAL_TABLET | Freq: Every day | ORAL | Status: DC
  Administered 2022-03-17: 40 mg via ORAL
  Filled 2022-03-17: qty 2

## 2022-03-17 NOTE — Progress Notes (Addendum)
STROKE TEAM PROGRESS NOTE   INTERVAL HISTORY  Family at the bedside. Patient sitting in the chair, eating in NAD.  Has receptive aphasia and some mild expressive aphasia. Was able to follow commands and name objects today.  VSS. No new neurological events overnight   He complains of right knee pain and right calf pain, states possibly from prior fall. Will check Knee xray and [place] of lower extremities-pending. Right knee Xray with Minimal medial and patellofemoral compartment osteoarthritis. Will give Tylenol to see if helps with the pain of right knee  Increased gabapentin to '800mg'$  TID from '800mg'$  BID. Increased Zocor to '40mg'$  from 20 mg daily   Therapy recommending HH pt OT and family wishes to be discharged tomorrow. Recommend loop recorder prior to discharge, cardiology aware. Probable discharge tomorrorw   Vitals:   03/16/22 2300 03/16/22 2333 03/17/22 0329 03/17/22 0727  BP: (!) 111/91 (!) 143/71 134/67 (!) 109/53  Pulse: 76  70 75  Resp: '19  17 17  '$ Temp:  98.2 F (36.8 C) 98.1 F (36.7 C) 99 F (37.2 C)  TempSrc:  Oral Oral Oral  SpO2: 99%  95% 94%  Weight:       CBC:  Recent Labs  Lab 03/15/22 1859 03/16/22 0224 03/17/22 0229  WBC 8.9 6.9 7.2  NEUTROABS 6.1  --   --   HGB 11.9*  15.6 14.0 12.2*  HCT 36.2*  46.0 41.1 35.7*  MCV 98.1 92.4 92.0  PLT 130* 69* 138*    Basic Metabolic Panel:  Recent Labs  Lab 03/16/22 0224 03/17/22 0229  NA 140 144  K 3.0* 3.9  CL 106 113*  CO2 22 22  GLUCOSE 87 96  BUN 29* 18  CREATININE 2.19* 1.21  CALCIUM 8.4* 8.5*  MG  --  1.7    Lipid Panel:  Recent Labs  Lab 03/16/22 0224  CHOL 161  TRIG 99  HDL 41  CHOLHDL 3.9  VLDL 20  LDLCALC 100*    HgbA1c:  Recent Labs  Lab 03/15/22 1859  HGBA1C 5.3    Urine Drug Screen:  Recent Labs  Lab 03/16/22 0320  LABOPIA NONE DETECTED  COCAINSCRNUR NONE DETECTED  LABBENZ NONE DETECTED  AMPHETMU NONE DETECTED  THCU NONE DETECTED  LABBARB NONE DETECTED      Alcohol Level  Recent Labs  Lab 03/15/22 1859  ETH <10     IMAGING past 24 hours MR BRAIN WO CONTRAST  Result Date: 03/16/2022 CLINICAL DATA:  Acute neurologic deficit EXAM: MRI HEAD WITHOUT CONTRAST TECHNIQUE: Multiplanar, multiecho pulse sequences of the brain and surrounding structures were obtained without intravenous contrast. COMPARISON:  None Available. FINDINGS: Brain: Small acute infarct of the left caudate head multiple small foci of acute ischemia the posterior left parietal cortex. No acute or chronic hemorrhage. Normal white matter signal. Generalized volume loss. The midline structures are normal. Vascular: Major flow voids are preserved. Skull and upper cervical spine: Normal calvarium and skull base. Visualized upper cervical spine and soft tissues are normal. Sinuses/Orbits:No paranasal sinus fluid levels or advanced mucosal thickening. No mastoid or middle ear effusion. Normal orbits. IMPRESSION: Small acute infarct of the left caudate head and multiple small foci of acute ischemia the posterior left parietal cortex. No hemorrhage or mass effect. Electronically Signed   By: Ulyses Jarred M.D.   On: 03/16/2022 19:29    PHYSICAL EXAM GENERAL: well nourished, well developed male, cooperative and NAD Psych: Mood is stable HEENT: - Normocephalic and atraumatic, LUNGS - Clear  to auscultation bilaterally with no wheezes CV - S1S2 RRR, no m/r/g, equal pulses bilaterally. ABDOMEN - Soft, nontender, nondistended with normoactive BS Ext: warm, well perfused, intact peripheral pulses, no edema  NEURO:  Patient sitting in chair with daughter @ his side and @ times referring questions to her Mental Status: AA&Ox3 , follows commands , he is able to recite his age,dob, [place] [facility] and city [facility])  Language: speech with expressive deficits such as calling objects in primary language or @ times just responding yes/no. He can follow one step direction and cannot completed  complex  commands. He can name objects shown to him and most objects were correct. Naming, repetition, fluency, and comprehension intact. Cranial Nerves: PERRL 3 mm  EOMI, visual fields full, no facial asymmetry, facial sensation intact, hearing intact, tongue/uvula/soft palate midline, normal sternocleidomastoid and trapezius muscle strength. No evidence of tongue atrophy or fibrillations Motor: Moves all extremity through out  RUE 5/5     RLE 5/5     R Grip 5/5 LUE  5-/5    LLE 5-/5    L Grip 4/5  Tone: is normal and bulk is normal Sensation- Intact to light touch bilaterally Coordination: FTN intact bilaterally, no ataxia in BLE. Gait- deferred  ASSESSMENT/PLAN Kurt Parsons is a 76 y.o. male with history of  hypertensin, obesity, hyperlipidemia non small cell lung cancer (resected 03/2021)presenting with acute onset aphasia concerning for stroke with etiology to be determined after MRI Brain is completed . Patient received TNK with improvement of his aphasia..CTA Imaging also showed multifocal severe stenosis and short segment occlusion of l left MCA and branches. Patient also reported having  hypotension and with  known severe stenosis in the left MCA territory, he may have had symptomatic hypoperfusion versus atheroembolic   Stroke: Left MCA embolic infarct  etiology:   embolic  Code Stroke :CT head No acute abnormality. Frequent Neuro Checks Hold antiplatelet agent for 24 hours until MRI Brain is completed and confirms no significant hemorrhagic conversion MRI  Small acute infarct of the left caudate head and multiple small foci of acute ischemia the posterior left parietal cortex. No hemorrhage or mass effect. Risk factor modifications Telemtry monitoring for PAF .  Loop Recorder to be placed tomorrow. Cards aware LLE [place] pending  PT/OT/SLT Consultation for discharge planing HgbA1c is 5.3% @ goal LDL @ 100-              - PT consult, OT consult, Speech consult- HH PT/OT LDL  100-  zocor 40 mg qhs increased from home dose of '20mg'$   (Home Meds) HgbA1c 5.3 On ASA/Plavix for 3 weeks then ASA alone    Recent Labs    03/15/22 1843  GLUCAP 157*       Other Stroke Risk Factors Advanced Age >/= 65   Obesity, Body mass index is 28.41 kg/m., BMI >/= 30 associated with increased stroke risk, recommend weight loss, diet and exercise as appropriate   Hypertension Hyperlipidemia  Other Active Issues GERD Right knee pain -Tylenol PRN  Anemia -Hgb 12.2  Hospital day # 2  Beulah Gandy DNP, ACNPC-AG   I have personally obtained history,examined this patient, reviewed notes, independently viewed imaging studies, participated in medical decision making and plan of care.ROS completed by me personally and pertinent positives fully documented  I have made any additions or clarifications directly to the above note. Agree with note above.  Patient remains aphasic but is improving.  He has right knee and  calf pain which likely mechanical due to a previous fall but will check x-ray of the knee and lower extremity venous Dopplers for DVT.  Patient will need loop recorder at discharge.  Continue cardiac monitoring.  Long discussion patient and his daughter at the bedside and answered questions.  Greater than 50% time during this 35-minute spent on counseling and coordination of care and discussion with patient and care team and answering questions.  Antony Contras, MD Medical Director [facility] Pager: [phone] 03/17/2022 3:45 PM  To contact Stroke Continuity provider, please refer to [email or web address] After hours, contact General Neurology

## 2022-03-17 NOTE — Evaluation (Signed)
Occupational Therapy Evaluation Patient Details Name: Kurt Parsons MRN: [id number] DOB: 18-Oct-1945 Today's Date: 03/17/2022   History of Present Illness Pt is a 76 y.o. male who presented 03/15/22 with R-sided weakness and aphasia. 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 PMH: glaucoma, HTN, HLD,right hydrocele, neuropathy, obesity, nonischemic cell lung cancer s/p resection (03/2021), C5-C6 posterior cervical decompression (1994), C6 5-C7 ACDF (left side, 19 95/1996)   Clinical Impression   Pt currently min guard assist for simulated selfcare tasks.  RUE coordination Lena for selfcare tasks including oral hygiene, donning gripper socks, and opening tops on his deodorant and toothpaste.  Slight shoulder pain with MMT but overall shoulder strength 3+/5  with elbow and hand strength at 4/5.  He was able to follow simple one step verbal commands and then needed demonstrational cueing for multi step or more complex tasks such as finger to nose testing, MMT testing.  Expressive deficits noted as well with pt 's speech being less intelligible at times.  No family present for eval but feel he will benefit from acute care OT to continue addressing balance deficits as well as ability to complete ADL tasks at a more independent level.  Recommend follow-up HHOT to see how he does in familiar setting with recommendation for 24 hr supervision for safety.       Recommendations for follow up therapy are one component of a multi-disciplinary discharge planning process, led by the attending physician.  Recommendations may be updated based on patient status, additional functional criteria and insurance authorization.   Follow Up Recommendations  Home health OT    Assistance Recommended at Discharge Frequent or constant Supervision/Assistance  Patient can return home with the following A little help with walking  and/or transfers;A lot of help with bathing/dressing/bathroom;Direct supervision/assist for financial management;Assist for transportation;Assistance with cooking/housework;Help with stairs or ramp for entrance;Direct supervision/assist for medications management    Functional Status Assessment  Patient has had a recent decline in their functional status and demonstrates the ability to make significant improvements in function in a reasonable and predictable amount of time.  Equipment Recommendations  None recommended by OT       Precautions / Restrictions Precautions Precautions: Fall;Other (comment) Restrictions Weight Bearing Restrictions: No      Mobility Bed Mobility   Bed Mobility: Supine to Sit, Sit to Supine     Supine to sit: Supervision Sit to supine: Supervision        Transfers Overall transfer level: Needs assistance Equipment used: None Transfers: Sit to/from Stand, Bed to chair/wheelchair/BSC Sit to Stand: Min guard     Step pivot transfers: Min guard     General transfer comment: Pt ambulated in the room at min guard without the RW and then progressed to outside of the room with the RW at the same level.      Balance Overall balance assessment: Mild deficits observed, not formally tested                                         ADL either performed or assessed with clinical judgement   ADL Overall ADL's : Needs assistance/impaired Eating/Feeding: Independent;Sitting   Grooming: Oral care;Supervision/safety;Standing   Upper Body Bathing: Supervision/ safety;Sitting Upper Body Bathing Details (indicate cue type and reason): simulated Lower Body Bathing: Min guard;Sit to/from stand  Upper Body Dressing : Supervision/safety;Sitting   Lower Body Dressing: Min guard;Sit to/from stand   Toilet Transfer: Min guard;Grab Environmental education officer and Hygiene: Min guard;Sit to/from stand        Functional mobility during ADLs: Min guard;Rolling walker (2 wheels) General ADL Comments: No family present for session.  Pt pleaseant and coorperative.  Able to use the RUE for donning gripper socks, opening deodorant and toothpaste without any difficulty.  Completed transfers initiatlly to the bathroom with min guard and no assistive device.  Provided hands on education for completion of mobility with use of the RW.  Mod demonstrational cueing needed for hand placement with sit to stand as he wants to keep his hands on the RW.  Will need initial 24 hr supervision for safety at home secondary to receptive and expressive deficits.     Vision Baseline Vision/History: 1 Wears glasses Ability to See in Adequate Light: 0 Adequate Patient Visual Report: No change from baseline Vision Assessment?: No apparent visual deficits;Yes Eye Alignment: Within Functional Limits Ocular Range of Motion: Within Functional Limits Alignment/Gaze Preference: Within Defined Limits Tracking/Visual Pursuits: Able to track stimulus in all quads without difficulty Saccades: Within functional limits Convergence: Within functional limits Visual Fields: No apparent deficits     Perception Perception Perception: Within Functional Limits   Praxis Praxis Praxis: Intact    Pertinent Vitals/Pain Pain Assessment Pain Assessment: No/denies pain     Hand Dominance Right   Extremity/Trunk Assessment Upper Extremity Assessment Upper Extremity Assessment: RUE deficits/detail RUE Deficits / Details: mild strength deficits at the shoulder but 3+/5 with elbow and grip 4/5 RUE Sensation: WNL (difficult to assess but seems to be WFLs) RUE Coordination: WNL   Lower Extremity Assessment Lower Extremity Assessment: Defer to PT evaluation   Cervical / Trunk Assessment Cervical / Trunk Assessment: Normal   Communication Communication Communication: Receptive difficulties;Expressive difficulties   Cognition  Arousal/Alertness: Awake/alert Behavior During Therapy: WFL for tasks assessed/performed Overall Cognitive Status: Difficult to assess                                 General Comments: Pt inconsistent with one step commands.  Able to follow 75% of them but exhibits more difficulty with multi step such as with MMT or finger to nose testing.  Did well following visual instructions along with verbal instructions.  Expressive difficulties noted as well with attempts of general conversation.                Home Living Family/patient expects to be discharged to:: Private residence Living Arrangements: Spouse/significant other Available Help at Discharge: Family;Personal care attendant;Available 24 hours/day (wife works in Personal assistant, but is willing to hire help if needed) Type of Home: House Home Access: Stairs to enter Technical brewer of Steps: 2-3 Entrance Stairs-Rails: None Home Layout: One level     Bathroom Shower/Tub: Occupational psychologist: Programmer, systems: Yes How Accessible: Accessible via Blum: Helix - built in          Prior Functioning/Environment Prior Level of Function : Independent/Modified Independent;Driving;History of Falls (last six months)             Mobility Comments: Pt reporting a recent fall onto his R side, but unclear of the time line of this fall. ADLs Comments: No family present pt insinuating he was able to complete selfcare without assistance but  exhibits expressive difficulties.        OT Problem List: Decreased strength;Impaired balance (sitting and/or standing);Decreased safety awareness      OT Treatment/Interventions: Self-care/ADL training;Therapeutic activities;Therapeutic exercise;Neuromuscular education;Cognitive remediation/compensation;Patient/family education;Balance training;DME and/or AE instruction    OT Goals(Current goals can be found in the care plan  section) Acute Rehab OT Goals Patient Stated Goal: Pt did not state OT Goal Formulation: With patient Time For Goal Achievement: 03/31/22 Potential to Achieve Goals: Good  OT Frequency: Min 2X/week       AM-PAC OT "6 Clicks" Daily Activity     Outcome Measure Help from another person eating meals?: None Help from another person taking care of personal grooming?: A Little Help from another person toileting, which includes using toliet, bedpan, or urinal?: A Little Help from another person bathing (including washing, rinsing, drying)?: A Little Help from another person to put on and taking off regular upper body clothing?: A Little Help from another person to put on and taking off regular lower body clothing?: A Little 6 Click Score: 19   End of Session Equipment Utilized During Treatment: Gait belt;Rolling walker (2 wheels) Nurse Communication: Mobility status  Activity Tolerance: Patient tolerated treatment well Patient left: in bed;with call bell/phone within reach;with bed alarm set  OT Visit Diagnosis: Unsteadiness on feet (R26.81);Muscle weakness (generalized) (M62.81);Other symptoms and signs involving cognitive function                Time: 1440-1516 OT Time Calculation (min): 36 min Charges:  OT General Charges $OT Visit: 1 Visit OT Evaluation $OT Eval Moderate Complexity: 1 Mod OT Treatments $Self Care/Home Management : 8-22 mins Durrel Mcnee OTR/L 03/17/2022, 3:52 PM

## 2022-03-17 NOTE — Progress Notes (Signed)
Lower extremity venous bilateral study completed.   Please see CV Proc for preliminary results.   Kaden Dunkel, RDMS, RVT  

## 2022-03-17 NOTE — Progress Notes (Signed)
Patient arrived to unit via 4N-ICU. Patient Aox3,vitals WNL and focused assessment completed.   Patient oriented to room, bed alarm on and call light in reach. Patients belongings at bedside.

## 2022-03-17 NOTE — Evaluation (Signed)
Physical Therapy Evaluation Patient Details Name: Kurt Parsons MRN: [id number] DOB: [birth year] Today's Date: 03/17/2022  History of Present Illness  Pt is a 76 y.o. male who presented 03/15/22 with R-sided weakness and aphasia. 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 PMH: glaucoma, HTN, HLD,right hydrocele, neuropathy, obesity, nonischemic cell lung cancer s/p resection (03/2021), C5-C6 posterior cervical decompression (1994), C6 5-C7 ACDF (left side, 19 95/1996)   Clinical Impression  Pt presents with condition above and deficits mentioned below, see PT Problem List. PTA, he was IND without DME, living with his wife in a 1-level house with 2-3 STE. Currently, pt is demonstrating deficits primarily in communication and R-sided strength. While pt does display R lower extremity weakness with MMT, he was able to ambulate within the room without UE support at a min guard assist level without noted knee buckling or LOB. Limited distance at this time due to noted R calf pain and unclear timeline of when it began. Notified RN and MD. Pt would benefit from further acute PT services to challenge his balance, to train pt on stairs, and to progress his mobility distance while addressing his noted deficits. Recommending follow-up with HHPT.        Recommendations for follow up therapy are one component of a multi-disciplinary discharge planning process, led by the attending physician.  Recommendations may be updated based on patient status, additional functional criteria and insurance authorization.  Follow Up Recommendations Home health PT      Assistance Recommended at Discharge Frequent or constant Supervision/Assistance  Patient can return home with the following  A little help with walking and/or transfers;A little help with bathing/dressing/bathroom;Assistance with cooking/housework;Direct  supervision/assist for medications management;Direct supervision/assist for financial management;Assist for transportation;Help with stairs or ramp for entrance    Equipment Recommendations Rolling walker (2 wheels) (vs no AD pending progress)  Recommendations for Other Services       Functional Status Assessment Patient has had a recent decline in their functional status and demonstrates the ability to make significant improvements in function in a reasonable and predictable amount of time.     Precautions / Restrictions Precautions Precautions: Fall;Other (comment) Precaution Comments: BP < 180/105 Restrictions Weight Bearing Restrictions: No      Mobility  Bed Mobility Overal bed mobility: Needs Assistance Bed Mobility: Supine to Sit     Supine to sit: HOB elevated, Min guard     General bed mobility comments: Min guard for safety and line management.    Transfers Overall transfer level: Needs assistance Equipment used: Rolling walker (2 wheels) Transfers: Sit to/from Stand Sit to Stand: Min guard           General transfer comment: Cues for hand placement on bed to push up to stand, min guard for safety, no LOB.    Ambulation/Gait Ambulation/Gait assistance: Min guard Gait Distance (Feet): 45 Feet Assistive device: Rolling walker (2 wheels), None Gait Pattern/deviations: Step-through pattern, Decreased stride length, Narrow base of support Gait velocity: reduced Gait velocity interpretation: <1.8 ft/sec, indicate of risk for recurrent falls   General Gait Details: Initially utilizing RW, but progressed to no UE support. No knee buckling or LOB noted, but narrow BOS noted when ambulating without AD. Min guard for safety. Limited distance due to noted R calf pain and to ensure pt safety, notifying RN and MD.  [place] Raid  Wheelchair Mobility    Modified Rankin (Stroke Patients Only) Modified Rankin (Stroke Patients Only) Pre-Morbid Rankin Score:  No symptoms Modified Rankin: Moderate disability     Balance Overall balance assessment: Mild deficits observed, not formally tested                                           Pertinent Vitals/Pain Pain Assessment Pain Assessment: Faces Faces Pain Scale: Hurts little more Pain Location: R knee, R shoulder, back, R calf Pain Descriptors / Indicators: Discomfort, Grimacing, Guarding Pain Intervention(s): Monitored during session, Limited activity within patient's tolerance, Repositioned, Other (comment) (notified RN of R calf pain, unclear of timeline of onset)    Home Living Family/patient expects to be discharged to:: Private residence Living Arrangements: Spouse/significant other Available Help at Discharge: Family;Personal care attendant;Available 24 hours/day (wife works in Personal assistant, but is willing to hire help if needed) Type of Home: House Home Access: Stairs to enter Entrance Stairs-Rails: None Technical brewer of Steps: 2-3   Home Layout: One level [place]: Civil engineer, contracting - built in      Prior Function Prior Level of Function : Independent/Modified Independent;Driving;History of Falls (last six months)             Mobility Comments: Pt reporting a recent fall onto his R side, but unclear of the time line of this fall.       Hand Dominance   Dominant Hand: Right    Extremity/Trunk Assessment   Upper Extremity Assessment Upper Extremity Assessment: Defer to OT evaluation    Lower Extremity Assessment Lower Extremity Assessment: RLE deficits/detail;LLE deficits/detail RLE Deficits / Details: MMT scores of 3+ hip flexion, 4 knee extension, 3+ ankle dorsiflexion; denies numbness/tingling bil RLE Sensation: WNL LLE Deficits / Details: MMT scores of 4 hip flexion, 5 knee extension, 4+ ankle dorsiflexion; denies numbness/tingling bil LLE Sensation: WNL    Cervical / Trunk Assessment Cervical / Trunk Assessment: Normal   Communication   Communication: Receptive difficulties;Expressive difficulties  Cognition Arousal/Alertness: Awake/alert Behavior During Therapy: WFL for tasks assessed/performed Overall Cognitive Status: Difficult to assess                                 General Comments: Pt needing cues repeated slowly. Unclear if deficits in comprehension/command following were due more to communication deficits or cognitive deficits. Attempted to use video interpreter in Arabic (primary language) but pt reported this confused him more, so utilized [place] throughout session        General Comments General comments (skin integrity, edema, etc.): VSS on RA; daughter present during session    Exercises     Assessment/Plan    PT Assessment Patient needs continued PT services  PT Problem List Decreased strength;Decreased activity tolerance;Decreased balance;Decreased mobility;Decreased cognition       PT Treatment Interventions DME instruction;Stair training;Gait training;Functional mobility training;Therapeutic activities;Therapeutic exercise;Balance training;Neuromuscular re-education;Cognitive remediation;Patient/family education    PT Goals (Current goals can be found in the Care Plan section)  Acute Rehab PT Goals Patient Stated Goal: to get better PT Goal Formulation: With patient/family Time For Goal Achievement: 03/31/22 Potential to Achieve Goals: Good    Frequency Min 4X/week     Co-evaluation               AM-PAC PT "6 Clicks" Mobility  Outcome Measure  Help needed turning from your back to your side while in a flat bed without using bedrails?: A Little Help needed moving from lying on your back to sitting on the side of a flat bed without using bedrails?: A Little Help needed moving to and from a bed to a chair (including a wheelchair)?: A Little Help needed standing up from a chair using your arms (e.g., wheelchair or bedside chair)?: A Little Help needed  to walk in hospital room?: A Little Help needed climbing 3-5 steps with a railing? : A Little 6 Click Score: 18    End of Session Equipment Utilized During Treatment: Gait belt Activity Tolerance: Patient tolerated treatment well Patient left: in chair;with call bell/phone within reach;with chair alarm set;with family/visitor present Nurse Communication: Mobility status;Other (comment) (calf pain) PT Visit Diagnosis: Unsteadiness on feet (R26.81);Other abnormalities of gait and mobility (R26.89);Muscle weakness (generalized) (M62.81);Difficulty in walking, not elsewhere classified (R26.2);History of falling (Z91.81);Other symptoms and signs involving the nervous system (R29.898)    Time: 3875-6433 PT Time Calculation (min) (ACUTE ONLY): 33 min   Charges:   PT Evaluation $PT Eval Moderate Complexity: 1 Mod PT Treatments $Therapeutic Activity: 8-22 mins        Moishe Spice, PT, DPT Acute Rehabilitation Services  Office: [phone]   Orvan Falconer 03/17/2022, 10:09 AM

## 2022-03-18 ENCOUNTER — Encounter (HOSPITAL_COMMUNITY)
Admission: EM | Disposition: A | Discharge status: Home Health | Payer: Self-pay | Source: Home / Self Care | Attending: Neurology

## 2022-03-18 HISTORY — PX: LOOP RECORDER INSERTION: EP1214

## 2022-03-18 SURGERY — LOOP RECORDER INSERTION

## 2022-03-18 MED ORDER — ASPIRIN 81 MG PO CHEW: 81.0000 mg | CHEWABLE_TABLET | Freq: Every day | ORAL | 0 refills | Status: DC

## 2022-03-18 MED ORDER — CLOPIDOGREL BISULFATE 75 MG PO TABS: 75.0000 mg | ORAL_TABLET | Freq: Every day | ORAL | 0 refills | Status: AC

## 2022-03-18 MED ORDER — LIDOCAINE-EPINEPHRINE 1 %-1:100000 IJ SOLN
INTRAMUSCULAR | Status: DC | PRN
  Administered 2022-03-18: 15 mL

## 2022-03-18 MED ORDER — GABAPENTIN 400 MG PO CAPS: 800.0000 mg | ORAL_CAPSULE | Freq: Three times a day (TID) | ORAL | 0 refills | Status: DC

## 2022-03-18 MED ORDER — SIMVASTATIN 40 MG PO TABS: 40.0000 mg | ORAL_TABLET | Freq: Every day | ORAL | 1 refills | Status: AC

## 2022-03-18 MED ORDER — LIDOCAINE-EPINEPHRINE 1 %-1:100000 IJ SOLN
INTRAMUSCULAR | Status: AC
  Filled 2022-03-18: qty 1

## 2022-03-18 SURGICAL SUPPLY — 2 items
MONITOR CARDIAC ASSERT IQ EL (Prosthesis & Implant Heart) IMPLANT
PACK LOOP INSERTION (CUSTOM PROCEDURE TRAY) ×1 IMPLANT

## 2022-03-18 NOTE — Progress Notes (Signed)
Physical Therapy Treatment Patient Details Name: Kurt Parsons MRN: [id number] DOB: Nov 09, 1945 Today's Date: 03/18/2022   History of Present Illness Pt is a 76 y.o. male who presented 03/15/22 with R-sided weakness and aphasia. 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 PMH: glaucoma, HTN, HLD,right hydrocele, neuropathy, obesity, nonischemic cell lung cancer s/p resection (03/2021), C5-C6 posterior cervical decompression (1994), C6 5-C7 ACDF (left side, 19 95/1996)    PT Comments    Pt is demonstrating good progress with mobility, ambulating and navigating stairs without physical assistance or UE support. However, he does display some balance deficits due to placing his R foot medially/in a narrow position intermittently when ambulating, placing him at risk for falls. Educated pt's wife on his risk for falls and to assist/supervise him as needed/as is safe with gait, stairs, meds, finances, and cooking. She verbalized understanding. Will continue to follow acutely. Current recommendations remain appropriate.    Recommendations for follow up therapy are one component of a multi-disciplinary discharge planning process, led by the attending physician.  Recommendations may be updated based on patient status, additional functional criteria and insurance authorization.  Follow Up Recommendations  Home health PT     Assistance Recommended at Discharge Frequent or constant Supervision/Assistance  Patient can return home with the following A little help with walking and/or transfers;A little help with bathing/dressing/bathroom;Assistance with cooking/housework;Direct supervision/assist for medications management;Direct supervision/assist for financial management;Assist for transportation;Help with stairs or ramp for entrance   Equipment Recommendations  Rolling walker (2 wheels)    Recommendations for  Other Services       Precautions / Restrictions Precautions Precautions: Fall;Other (comment) Precaution Comments: BP < 180/105 Restrictions Weight Bearing Restrictions: No     Mobility  Bed Mobility Overal bed mobility: Needs Assistance Bed Mobility: Supine to Sit     Supine to sit: HOB elevated, Modified independent (Device/Increase time)     General bed mobility comments: No assistance needed.    Transfers Overall transfer level: Needs assistance Equipment used: None Transfers: Sit to/from Stand Sit to Stand: Min guard           General transfer comment: min guard for safety, no LOB.    Ambulation/Gait Ambulation/Gait assistance: Min guard Gait Distance (Feet): 230 Feet Assistive device: None Gait Pattern/deviations: Step-through pattern, Decreased stride length, Narrow base of support Gait velocity: reduced Gait velocity interpretation: 1.31 - 2.62 ft/sec, indicative of limited community ambulator   General Gait Details: Pt ambulating without any knee buckling noted. Intermittent narrow placement of R foot when stepping, resulting in pt taking reactional step with L as needed, but did not require assistance to recover. This occured also when cued to change head postions or directions.   Stairs Stairs: Yes Stairs assistance: Min guard Stair Management: No rails, Two rails, Alternating pattern, Forwards Number of Stairs: 10 General stair comments: Ascends and descends alternating between step-to pattern and reciprocal stepping pattern, leading with either foot without knee buckling or LOB, min guard for safety   Wheelchair Mobility    Modified Rankin (Stroke Patients Only) Modified Rankin (Stroke Patients Only) Pre-Morbid Rankin Score: No symptoms Modified Rankin: Moderate disability     Balance Overall balance assessment: Mild deficits observed, not formally tested  Cognition  Arousal/Alertness: Awake/alert Behavior During Therapy: Impulsive (mildly) Overall Cognitive Status: Difficult to assess                                 General Comments: Pt needing cues repeated slowly. Unclear if deficits in comprehension/command following were due more to communication deficits or cognitive deficits. Pt mildly impulsive to continue a task despite being cued to stop. Still unclear if this may be due to deficits in reception though.        Exercises      General Comments General comments (skin integrity, edema, etc.): educated pt's wife on supervising/assisting as needed with meds, finances, cooking, walking, and stairs and provided her with gait belt      Pertinent Vitals/Pain Pain Assessment Pain Assessment: Faces Faces Pain Scale: No hurt Pain Intervention(s): Monitored during session    Home Living                          Prior Function            PT Goals (current goals can now be found in the care plan section) Acute Rehab PT Goals Patient Stated Goal: to go home PT Goal Formulation: With patient/family Time For Goal Achievement: 03/31/22 Potential to Achieve Goals: Good Progress towards PT goals: Progressing toward goals    Frequency    Min 4X/week      PT Plan Current plan remains appropriate    Co-evaluation              AM-PAC PT "6 Clicks" Mobility   Outcome Measure  Help needed turning from your back to your side while in a flat bed without using bedrails?: None Help needed moving from lying on your back to sitting on the side of a flat bed without using bedrails?: None Help needed moving to and from a bed to a chair (including a wheelchair)?: A Little Help needed standing up from a chair using your arms (e.g., wheelchair or bedside chair)?: A Little Help needed to walk in hospital room?: A Little Help needed climbing 3-5 steps with a railing? : A Little 6 Click Score: 20    End of Session  Equipment Utilized During Treatment: Gait belt Activity Tolerance: Patient tolerated treatment well Patient left: with call bell/phone within reach;with family/visitor present;in bed Nurse Communication: Mobility status PT Visit Diagnosis: Unsteadiness on feet (R26.81);Other abnormalities of gait and mobility (R26.89);Muscle weakness (generalized) (M62.81);Difficulty in walking, not elsewhere classified (R26.2);History of falling (Z91.81);Other symptoms and signs involving the nervous system (R29.898)     Time: 9390-3009 PT Time Calculation (min) (ACUTE ONLY): 16 min  Charges:  $Gait Training: 8-22 mins                     Moishe Spice, PT, DPT Acute Rehabilitation Services  Office: [place] 03/18/2022, 2:12 PM

## 2022-03-18 NOTE — Consult Note (Signed)
ELECTROPHYSIOLOGY CONSULT NOTE  Patient ID: Kurt Parsons MRN: [id number], DOB/AGE: [birth year]   Admit date: 03/15/2022 Date of Consult: 03/18/2022  Primary Physician: Loni Muse, MD Primary Cardiologist: None  Primary Electrophysiologist: New to None  Reason for Consultation: Cryptogenic stroke; recommendations regarding Implantable Loop Recorder Insurance: [facility] Medicare  History of Present Illness EP has been asked to evaluate Kurt Parsons for placement of an implantable loop recorder to monitor for atrial fibrillation by Dr Leonie Man.  The patient was admitted on 03/15/2022 with acute onset aphasia concerning for stroke with etiology to be determined after MRI Brain is completed . Patient received TNK with improvement of his aphasia..CTA Imaging also showed multifocal severe stenosis and short segment occlusion of l left MCA and branches. Patient also reported having hypotension and with  known severe stenosis in the left MCA territory, he may have had symptomatic hypoperfusion versus atheroembolic.    Imaging demonstrated Left MCA embolic infarct  etiology:   embolic     He has undergone workup for stroke:   Code Stroke :CT head No acute abnormality. Frequent Neuro Checks Hold antiplatelet agent for 24 hours until MRI Brain is completed and confirms no significant hemorrhagic conversion MRI  Small acute infarct of the left caudate head and multiple small foci of acute ischemia the posterior left parietal cortex. No hemorrhage or mass effect. Risk factor modifications Telemtry monitoring for PAF .  LLE [place] pending  PT/OT/SLT Consultation for discharge planing HgbA1c is 5.3% @ goal LDL @ 100-           - PT consult, OT consult, Speech consult- HH PT/OT LDL 100-  zocor 40 mg qhs increased from home dose of '20mg'$   (Home Meds) HgbA1c 5.3 On ASA/Plavix for 3 weeks then ASA alone  The patient has been monitored on telemetry which has demonstrated sinus rhythm with no  arrhythmias.  Inpatient stroke work-up will not require a TEE per Neurology.   Echocardiogram as above. Lab work is reviewed.  Prior to admission, the patient denies chest pain, shortness of breath, dizziness, palpitations, or syncope.  He is recovering from his stroke with plans to return home  at discharge.  Past Medical History:  Diagnosis Date   Back pain    Chronic pain syndrome    takes Morphine daily   Constipation    takes Colace daily   Dizziness    occasionally   ED (erectile dysfunction)    takes Celexa daily   Gallbladder sludge    GERD (gastroesophageal reflux disease)    takes Omeprazole daily   Glaucoma    mild per pt   Hyperlipidemia    takes Zocor nightly   Hypertension    takes Lisinopril daily   Inguinal hernia    Internal hemorrhoids    Lazy eye rt eye   Liver cyst    Neck pain    Neuropathy    Right hydrocele    Sleep disturbance    takes Ambien prn   Ulcer      Surgical History:  Past Surgical History:  Procedure Laterality Date   BACK SURGERY     bilateral cataract removed     CERVICAL DISC SURGERY     x 2   COLONOSCOPY     HEMORRHOID SURGERY     INGUINAL HERNIA REPAIR Right 03/21/2013   Procedure: HERNIA REPAIR INGUINAL ADULT;  Surgeon: Imogene Burn. Georgette Dover, MD;  Location: [place];  Service: General;  Laterality: Right;   INSERTION OF  MESH Right 03/21/2013   Procedure: INSERTION OF MESH;  Surgeon: Imogene Burn. Tsuei, MD;  Location: [place];  Service: General;  Laterality: Right;   LUMBAR DISC SURGERY     x 2   NECK SURGERY       Medications Prior to Admission  Medication Sig Dispense Refill Last Dose   amLODipine (NORVASC) 10 MG tablet Take 10 mg by mouth daily.   03/15/2022   amLODipine (NORVASC) 10 MG tablet Take 1 tablet by mouth daily.   03/15/2022   aspirin EC 81 MG tablet Take 1 tablet (81 mg total) by mouth daily.   03/15/2022   aspirin EC 81 MG tablet Take 1 tablet by mouth daily.   03/15/2022   celecoxib (CELEBREX) 100 MG capsule Take  100 mg by mouth 2 (two) times daily.   03/15/2022   celecoxib (CELEBREX) 100 MG capsule Take 100 mg by mouth 2 (two) times daily.   03/15/2022   chlorhexidine (PERIDEX) 0.12 % solution Use as directed 15 mLs in the mouth or throat 2 (two) times daily.   03/15/2022   chlorhexidine (PERIDEX) 0.12 % solution Use as directed 15 mLs in the mouth or throat 2 (two) times daily.   03/15/2022   cyanocobalamin (VITAMIN B12) 1000 MCG tablet Take 1 tablet by mouth daily.   03/15/2022   gabapentin (NEURONTIN) 100 MG capsule Take 800 mg by mouth 3 (three) times daily.   03/16/2022   gabapentin (NEURONTIN) 800 MG tablet take 1 tablet by mouth twice a day (Patient taking differently: Take 800 mg by mouth 3 (three) times daily.) 60 tablet 0 03/16/2022   hydrochlorothiazide (HYDRODIURIL) 25 MG tablet Take 25 mg by mouth daily.   03/15/2022   ibuprofen (ADVIL) 200 MG tablet Take 200 mg by mouth every 6 (six) hours as needed for mild pain or moderate pain.   unk   olmesartan (BENICAR) 20 MG tablet Take 20 mg by mouth daily.   03/15/2022   omeprazole (PRILOSEC) 40 MG capsule Take 1 capsule (40 mg total) by mouth daily. 100 capsule 3 03/15/2022   simvastatin (ZOCOR) 20 MG tablet Take 1 tablet (20 mg total) by mouth at bedtime. 100 tablet 3 03/15/2022   tiZANidine (ZANAFLEX) 2 MG tablet Take 2 mg by mouth at bedtime.   03/15/2022   LORazepam (ATIVAN) 0.5 MG tablet Half tab at bedtime as needed. (Patient not taking: Reported on 03/16/2022) 60 tablet 3 Not Taking   methocarbamol (ROBAXIN) 750 MG tablet Take 750 mg by mouth 3 (three) times daily. (Patient not taking: Reported on 03/16/2022)   Not Taking   venlafaxine XR (EFFEXOR-XR) 75 MG 24 hr capsule Take 75 mg by mouth daily. (Patient not taking: Reported on 03/16/2022)   Not Taking    Inpatient Medications:   aspirin  81 mg Oral Daily   Or   aspirin  300 mg Rectal Daily   Chlorhexidine Gluconate Cloth  6 each Topical Q0600   clopidogrel  75 mg Oral Daily   enoxaparin  (LOVENOX) injection  40 mg Subcutaneous QHS   gabapentin  800 mg Oral TID   pantoprazole  40 mg Oral QHS   simvastatin  40 mg Oral q1800    Allergies:  Allergies  Allergen Reactions   Zestril [Lisinopril] Swelling and Other (See Comments)    Angioedema   Diazepam Other (See Comments)    Makes him loopy and out of it.   Lactose Intolerance (Gi) Diarrhea    Social History   Socioeconomic  History   Marital status: Married    Spouse name: Not on file   Number of children: 1   Years of education: Not on file   Highest education level: Not on file  Occupational History   Occupation: retired    Fish farm manager: UNEMPLOYED  Tobacco Use   Smoking status: Former   Smokeless tobacco: Never   Tobacco comments:    quit in 1976  Substance and Sexual Activity   Alcohol use: No    Comment: quit in 1989   Drug use: No   Sexual activity: Yes  Other Topics Concern   Not on file  Social History Narrative   Not on file   Social Determinants of Health   Financial Resource Strain: Not on file  Food Insecurity: Not on file  Transportation Needs: Not on file  Physical Activity: Not on file  Stress: Not on file  Social Connections: Not on file  Intimate Partner Violence: Not on file     Family History  Problem Relation Age of Onset   Diabetes Other    Hypertension Other    Stroke Other    Heart disease Other    COPD Other       Review of Systems: All other systems reviewed and are otherwise negative except as noted above.  Physical Exam: Vitals:   03/17/22 1924 03/17/22 2305 03/18/22 0325 03/18/22 0734  BP: 127/72 114/76 118/73 (!) 117/56  Pulse: 75 74 63 (!) 59  Resp: 17  15 (!) 24  Temp: 99.1 F (37.3 C) 99.1 F (37.3 C) 98.8 F (37.1 C) 98.5 F (36.9 C)  TempSrc: Oral Oral Oral Oral  SpO2: 97% 99% 98% 96%  Weight:        GEN- The patient is well appearing, alert. Intermittently answers questions appropriately. Head- normocephalic, atraumatic Eyes-  Sclera clear,  conjunctiva pink Ears- hearing intact Oropharynx- clear Neck- supple Lungs- Clear to ausculation bilaterally, normal work of breathing Heart- Regular rate and rhythm, no murmurs, rubs or gallops  GI- soft, NT, ND, + BS Extremities- no clubbing, cyanosis, or edema MS- no significant deformity or atrophy Skin- no rash or lesion Psych- euthymic mood, full affect  Daughter at bedside  Labs:   Lab Results  Component Value Date   WBC 7.2 03/17/2022   HGB 12.2 (L) 03/17/2022   HCT 35.7 (L) 03/17/2022   MCV 92.0 03/17/2022   PLT 138 (L) 03/17/2022    Recent Labs  Lab 03/16/22 0224 03/17/22 0229  NA 140 144  K 3.0* 3.9  CL 106 113*  CO2 22 22  BUN 29* 18  CREATININE 2.19* 1.21  CALCIUM 8.4* 8.5*  PROT 6.7  --   BILITOT 1.4*  --   ALKPHOS 64  --   ALT 26  --   AST 26  --   GLUCOSE 87 96     Radiology/Studies: VAS [place] LOWER EXTREMITY VENOUS (DVT)  Result Date: 03/17/2022  Lower Venous DVT Study Patient Name:  NAEEM QUILLIN  Date of Exam:   03/17/2022 Medical Rec #: [id number]         Accession #:    [id number] Date of Birth: 01/31/1946          Patient Gender: M Patient Age:   47 years Exam Location:  [facility] Procedure:      VAS [place] LOWER EXTREMITY VENOUS (DVT) Referring Phys: Langley Gauss WOLFE --------------------------------------------------------------------------------  Indications: Stroke.  Comparison Study: No prior studies. Performing Technologist: Darlin Coco RDMS, RVT  Examination Guidelines: A complete evaluation includes B-mode imaging, spectral Doppler, color Doppler, and power Doppler as needed of all accessible portions of each vessel. Bilateral testing is considered an integral part of a complete examination. Limited examinations for reoccurring indications may be performed as noted. The reflux portion of the exam is performed with the patient in reverse Trendelenburg.  +---------+---------------+---------+-----------+----------+--------------+ RIGHT     CompressibilityPhasicitySpontaneityPropertiesThrombus Aging +---------+---------------+---------+-----------+----------+--------------+ CFV      Full           Yes      Yes                                 +---------+---------------+---------+-----------+----------+--------------+ SFJ      Full                                                        +---------+---------------+---------+-----------+----------+--------------+ FV Prox  Full                                                        +---------+---------------+---------+-----------+----------+--------------+ FV Mid   Full                                                        +---------+---------------+---------+-----------+----------+--------------+ FV DistalFull                                                        +---------+---------------+---------+-----------+----------+--------------+ PFV      Full                                                        +---------+---------------+---------+-----------+----------+--------------+ POP      Full           Yes      Yes                                 +---------+---------------+---------+-----------+----------+--------------+ PTV      Full                                                        +---------+---------------+---------+-----------+----------+--------------+ PERO     Full                                                        +---------+---------------+---------+-----------+----------+--------------+  Gastroc  Full                                                        +---------+---------------+---------+-----------+----------+--------------+   +---------+---------------+---------+-----------+----------+--------------+ LEFT     CompressibilityPhasicitySpontaneityPropertiesThrombus Aging +---------+---------------+---------+-----------+----------+--------------+ CFV      Full           Yes      Yes                                  +---------+---------------+---------+-----------+----------+--------------+ SFJ      Full                                                        +---------+---------------+---------+-----------+----------+--------------+ FV Prox  Full                                                        +---------+---------------+---------+-----------+----------+--------------+ FV Mid   Full                                                        +---------+---------------+---------+-----------+----------+--------------+ FV DistalFull                                                        +---------+---------------+---------+-----------+----------+--------------+ PFV      Full                                                        +---------+---------------+---------+-----------+----------+--------------+ POP      Full           Yes      Yes                                 +---------+---------------+---------+-----------+----------+--------------+ PTV      Full                                                        +---------+---------------+---------+-----------+----------+--------------+ PERO     Full                                                        +---------+---------------+---------+-----------+----------+--------------+  Gastroc  Full                                                        +---------+---------------+---------+-----------+----------+--------------+     Summary: RIGHT: - There is no evidence of deep vein thrombosis in the lower extremity.  - No cystic structure found in the popliteal fossa.  LEFT: - There is no evidence of deep vein thrombosis in the lower extremity.  - No cystic structure found in the popliteal fossa.  *See table(s) above for measurements and observations. Electronically signed by Harold Barban MD on 03/17/2022 at 9:30:14 PM.    Final    DG Knee 1-2 Views Right  Result Date: 03/17/2022 CLINICAL DATA:   Follow home.  Continued anterior right knee pain. EXAM: RIGHT KNEE - 1-2 VIEW COMPARISON:  None Available. FINDINGS: Minimal medial compartment joint space narrowing and peripheral degenerative spurring. Mild superior and inferior patellar degenerative osteophytosis. No joint effusion. No acute fracture or dislocation. IMPRESSION: Minimal medial and patellofemoral compartment osteoarthritis. Electronically Signed   By: Yvonne Kendall M.D.   On: 03/17/2022 12:52   MR BRAIN WO CONTRAST  Result Date: 03/16/2022 CLINICAL DATA:  Acute neurologic deficit EXAM: MRI HEAD WITHOUT CONTRAST TECHNIQUE: Multiplanar, multiecho pulse sequences of the brain and surrounding structures were obtained without intravenous contrast. COMPARISON:  None Available. FINDINGS: Brain: Small acute infarct of the left caudate head multiple small foci of acute ischemia the posterior left parietal cortex. No acute or chronic hemorrhage. Normal white matter signal. Generalized volume loss. The midline structures are normal. Vascular: Major flow voids are preserved. Skull and upper cervical spine: Normal calvarium and skull base. Visualized upper cervical spine and soft tissues are normal. Sinuses/Orbits:No paranasal sinus fluid levels or advanced mucosal thickening. No mastoid or middle ear effusion. Normal orbits. IMPRESSION: Small acute infarct of the left caudate head and multiple small foci of acute ischemia the posterior left parietal cortex. No hemorrhage or mass effect. Electronically Signed   By: Ulyses Jarred M.D.   On: 03/16/2022 19:29   ECHOCARDIOGRAM COMPLETE  Result Date: 03/16/2022    ECHOCARDIOGRAM REPORT   Patient Name:   MARQUEZE RAMCHARAN Date of Exam: 03/16/2022 Medical Rec #:  [id number]        Height:       70.0 in Accession #:    [id number]       Weight:       198.0 lb Date of Birth:  Jul 02, 1945         BSA:          2.078 m Patient Age:    20 years         BP:           101/55 mmHg Patient Gender: M                HR:            56 bpm. Exam Location:  Inpatient Procedure: 2D Echo, Cardiac Doppler and Color Doppler Indications:    Stroke  History:        Patient has no prior history of Echocardiogram examinations.                 Risk Factors:Dyslipidemia and Hypertension.  Sonographer:    Memory Argue Referring Phys: [id number] [place]  1. Left ventricular ejection fraction, by estimation, is 60 to 65%. The left ventricle has normal function. The left ventricle has no regional wall motion abnormalities. There is mild concentric left ventricular hypertrophy. Left ventricular diastolic parameters are consistent with Grade I diastolic dysfunction (impaired relaxation).  2. Right ventricular systolic function is normal. The right ventricular size is normal. There is normal pulmonary artery systolic pressure.  3. The mitral valve is normal in structure. No evidence of mitral valve regurgitation. No evidence of mitral stenosis.  4. The aortic valve was not well visualized. Aortic valve regurgitation is not visualized. No aortic stenosis is present.  5. Aortic dilatation noted. There is mild dilatation of the aortic root, measuring 40 mm.  6. The inferior vena cava is normal in size with greater than 50% respiratory variability, suggesting right atrial pressure of 3 mmHg. Comparison(s): No prior Echocardiogram. Conclusion(s)/Recommendation(s): Normal biventricular function without evidence of hemodynamically significant valvular heart disease. FINDINGS  Left Ventricle: Left ventricular ejection fraction, by estimation, is 60 to 65%. The left ventricle has normal function. The left ventricle has no regional wall motion abnormalities. The left ventricular internal cavity size was normal in size. There is  mild concentric left ventricular hypertrophy. Left ventricular diastolic parameters are consistent with Grade I diastolic dysfunction (impaired relaxation). Right Ventricle: The right ventricular size is normal. No  increase in right ventricular wall thickness. Right ventricular systolic function is normal. There is normal pulmonary artery systolic pressure. The tricuspid regurgitant velocity is 1.86 m/s, and  with an assumed right atrial pressure of 3 mmHg, the estimated right ventricular systolic pressure is 96.7 mmHg. Left Atrium: Left atrial size was normal in size. Right Atrium: Right atrial size was normal in size. Pericardium: There is no evidence of pericardial effusion. Mitral Valve: The mitral valve is normal in structure. No evidence of mitral valve regurgitation. No evidence of mitral valve stenosis. Tricuspid Valve: The tricuspid valve is normal in structure. Tricuspid valve regurgitation is not demonstrated. No evidence of tricuspid stenosis. Aortic Valve: The aortic valve was not well visualized. Aortic valve regurgitation is not visualized. No aortic stenosis is present. Aortic valve mean gradient measures 3.0 mmHg. Aortic valve peak gradient measures 5.9 mmHg. Aortic valve area, by VTI measures 3.39 cm. Pulmonic Valve: The pulmonic valve was normal in structure. Pulmonic valve regurgitation is not visualized. No evidence of pulmonic stenosis. Aorta: Aortic dilatation noted. There is mild dilatation of the aortic root, measuring 40 mm. Venous: The inferior vena cava is normal in size with greater than 50% respiratory variability, suggesting right atrial pressure of 3 mmHg. IAS/Shunts: No atrial level shunt detected by color flow Doppler.  LEFT VENTRICLE PLAX 2D LVIDd:         4.30 cm   Diastology LVIDs:         3.00 cm   LV e' medial:    5.75 cm/s LV PW:         1.10 cm   LV E/e' medial:  9.1 LV IVS:        1.10 cm   LV e' lateral:   9.79 cm/s LVOT diam:     2.40 cm   LV E/e' lateral: 5.3 LV SV:         87 LV SV Index:   42 LVOT Area:     4.52 cm  RIGHT VENTRICLE TAPSE (M-mode): 1.7 cm LEFT ATRIUM           Index        RIGHT  ATRIUM           Index LA diam:      2.70 cm 1.30 cm/m   RA Area:     13.40 cm LA  Vol (A2C): 54.5 ml 26.22 ml/m  RA Volume:   32.00 ml  15.40 ml/m LA Vol (A4C): 64.3 ml 30.94 ml/m  AORTIC VALVE AV Area (Vmax):    3.45 cm AV Area (Vmean):   3.39 cm AV Area (VTI):     3.39 cm AV Vmax:           121.00 cm/s AV Vmean:          76.000 cm/s AV VTI:            0.256 m AV Peak Grad:      5.9 mmHg AV Mean Grad:      3.0 mmHg LVOT Vmax:         92.40 cm/s LVOT Vmean:        56.900 cm/s LVOT VTI:          0.192 m LVOT/AV VTI ratio: 0.75  AORTA Ao Root diam: 4.00 cm MITRAL VALVE               TRICUSPID VALVE MV Area (PHT): 2.54 cm    TR Peak grad:   13.8 mmHg MV Decel Time: 299 msec    TR Vmax:        186.00 cm/s MV E velocity: 52.10 cm/s MV A velocity: 75.10 cm/s  SHUNTS MV E/A ratio:  0.69        Systemic VTI:  0.19 m                            Systemic Diam: 2.40 cm Rudean Haskell MD Electronically signed by Rudean Haskell MD Signature Date/Time: 03/16/2022/9:01:43 AM    Final    CT ANGIO HEAD NECK W WO CM (CODE STROKE)  Addendum Date: 03/15/2022   ADDENDUM REPORT: 03/15/2022 19:37 ADDENDUM: There is 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 Discussed with Dr. Curly Shores 7:36 p.m. on 03/15/2022. Electronically Signed   By: Ulyses Jarred M.D.   On: 03/15/2022 19:37   Result Date: 03/15/2022 CLINICAL DATA:  Bilateral weakness. EXAM: CT ANGIOGRAPHY HEAD AND NECK TECHNIQUE: Multidetector CT imaging of the head and neck was performed using the standard protocol during bolus administration of intravenous contrast. Multiplanar CT image reconstructions and MIPs were obtained to evaluate the vascular anatomy. Carotid stenosis measurements (when applicable) are obtained utilizing NASCET criteria, using the distal internal carotid diameter as the denominator. RADIATION DOSE REDUCTION: This exam was performed according to the departmental dose-optimization program which includes automated exposure control, adjustment of the mA  and/or kV according to patient size and/or use of iterative reconstruction technique. CONTRAST:  75 mL Omnipaque 350 COMPARISON:  None Available. FINDINGS: CTA NECK FINDINGS SKELETON: There is no bony spinal canal stenosis. No lytic or blastic lesion. C5-7 ACDF. OTHER NECK: Normal pharynx, larynx and major salivary glands. No cervical lymphadenopathy. Unremarkable thyroid gland. UPPER CHEST: No pneumothorax or pleural effusion. No nodules or masses. AORTIC ARCH: There is calcific atherosclerosis of the aortic arch. There is no aneurysm, dissection or hemodynamically significant stenosis of the visualized portion of the aorta. Conventional 3 vessel aortic branching pattern. The visualized proximal subclavian arteries are widely patent. RIGHT CAROTID SYSTEM: No dissection, occlusion or aneurysm. Mild atherosclerotic calcification at  the carotid bifurcation without hemodynamically significant stenosis. LEFT CAROTID SYSTEM: Normal without aneurysm, dissection or stenosis. VERTEBRAL ARTERIES: Left dominant configuration. Both origins are clearly patent. There is no dissection, occlusion or flow-limiting stenosis to the skull base (V1-V3 segments). CTA HEAD FINDINGS POSTERIOR CIRCULATION: --Vertebral arteries: Normal V4 segments. --Inferior cerebellar arteries: Normal. --Basilar artery: Normal. --Superior cerebellar arteries: Normal. --Posterior cerebral arteries (PCA): Normal. ANTERIOR CIRCULATION: --Intracranial internal carotid arteries: Normal. --Anterior cerebral arteries (ACA): Normal. Both A1 segments are present. Patent anterior communicating artery (a-comm). --Middle cerebral arteries (MCA): Normal. VENOUS SINUSES: As permitted by contrast timing, patent. ANATOMIC VARIANTS: Fetal origin of the right posterior cerebral artery. Review of the MIP images confirms the above findings. IMPRESSION: 1. No emergent large vessel occlusion or hemodynamically significant stenosis of the head or neck. 2. Mild right carotid  bifurcation atherosclerosis without hemodynamically significant stenosis. Aortic atherosclerosis (ICD10-I70.0). Electronically Signed: By: Ulyses Jarred M.D. On: 03/15/2022 19:14   CT HEAD CODE STROKE WO CONTRAST  Result Date: 03/15/2022 CLINICAL DATA:  Code stroke.  Bilateral weakness EXAM: CT HEAD WITHOUT CONTRAST TECHNIQUE: Contiguous axial images were obtained from the base of the skull through the vertex without intravenous contrast. RADIATION DOSE REDUCTION: This exam was performed according to the departmental dose-optimization program which includes automated exposure control, adjustment of the mA and/or kV according to patient size and/or use of iterative reconstruction technique. COMPARISON:  None Available. FINDINGS: Brain: There is no mass, hemorrhage or extra-axial collection. The size and configuration of the ventricles and extra-axial CSF spaces are normal. The brain parenchyma is normal, without evidence of acute or chronic infarction. Vascular: No abnormal hyperdensity of the major intracranial arteries or dural venous sinuses. No intracranial atherosclerosis. Skull: The visualized skull base, calvarium and extracranial soft tissues are normal. Sinuses/Orbits: No fluid levels or advanced mucosal thickening of the visualized paranasal sinuses. No mastoid or middle ear effusion. The orbits are normal. ASPECTS [facility] Stroke Program Early CT Score) - Ganglionic level infarction (caudate, lentiform nuclei, internal capsule, insula, M1-M3 cortex): 7 - Supraganglionic infarction (M4-M6 cortex): 3 Total score (0-10 with 10 being normal): 10 IMPRESSION: 1. No acute intracranial abnormality. 2. ASPECTS is 10. These results were communicated to Dr. Lesleigh Noe at 7:04 pm on 03/15/2022 by text page via the [facility] messaging system. Electronically Signed   By: Ulyses Jarred M.D.   On: 03/15/2022 19:09    12-lead ECG  (personally reviewed) All prior EKG's in EPIC reviewed with no documented atrial  fibrillation  Telemetry SR - ST, no afib noted (personally reviewed)  Assessment and Plan:  1. Cryptogenic stroke The patient presents with cryptogenic stroke.  The patient does not have a TEE planned for this AM. Previously wore an ambulatory EKG monitor in 2012 without afib noted. Mild receptive aphasia appreciated, Daughter at bedside throughout discussion. I spoke at length with the patient and daughter about monitoring for afib with an implantable loop recorder.  Risks, benefits, and alteratives to implantable loop recorder were discussed with the patient and daughter today.   At this time, the patient and daughter are very clear in their decision to proceed with implantable loop recorder.    Wound care was reviewed with the patient (keep incision clean and dry for 3 days).  Wound check scheduled and entered in AVS. Please call with questions.    Mamie Levers, NP 03/18/2022 9:24 AM

## 2022-03-18 NOTE — TOC Transition Note (Signed)
Transition of Care [facility]) - CM/SW Discharge Note   Patient Details  Name: Kurt Parsons MRN: [id number] Date of Birth: 1945/08/06  [facility] CM/SW Contact:  Verdell Carmine, RN Phone Number: 03/18/2022, 1:53 PM   Clinical Narrative:     Spoke to patients wife over the phone.Introduce self and roll.Home health securedvi a suncrest for PT and Otpatient requests [facility]called [facility] will be out of pocket   . [employer] will speak with wife regarding shower stool. Dr. Lucious Groves notified of home health orders needed prior to discharge Barriers to Discharge: No Barriers Identified   Patient Goals and CMS Choice Patient states their goals for this hospitalization and ongoing recovery are:: return home      Discharge Placement                       Discharge Plan and Services   Discharge Planning Services: CM Consult Post Acute Care Choice: Durable Medical Equipment, Home Health          DME Arranged: Shower stool DME Agency: [employer] Date DME Agency Contacted: 03/18/22 Time DME Agency Contacted: [phone] Representative spoke with at DME Agency: Requested via online parachute system HH Arranged: PT, OT HH Agency: [place] Date [place]: 03/18/22 Time [place]: 1343 Representative spoke with at [place]: angela  Social Determinants of Health ([place]) Interventions     Readmission Risk Interventions     No data to display

## 2022-03-18 NOTE — Discharge Summary (Addendum)
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   Lactose Intolerance (gi) Diarrhea        Medication List     STOP taking these medications    aspirin EC 81 MG tablet Replaced by: aspirin 81 MG chewable tablet   chlorhexidine 0.12 % solution Commonly known as: PERIDEX   gabapentin 100 MG capsule Commonly known as: NEURONTIN   gabapentin 800 MG tablet Commonly known as: NEURONTIN Replaced by: gabapentin 400 MG capsule   ibuprofen 200 MG tablet Commonly known as: ADVIL   LORazepam 0.5 MG tablet Commonly known as: ATIVAN   methocarbamol 750 MG tablet Commonly known as: ROBAXIN       TAKE these medications    amLODipine 10 MG tablet Commonly known as: NORVASC Take 1 tablet by mouth daily. What changed: Another medication with the same name was removed. Continue taking this medication, and follow the directions you see here.   aspirin 81 MG chewable tablet Chew 1 tablet (81 mg total) by mouth daily. Start taking on: March 19, 2022 Replaces: aspirin EC 81 MG tablet   celecoxib 100 MG capsule Commonly known as: CELEBREX Take 100 mg by mouth 2 (two) times daily. What changed: Another medication with the same name was removed. Continue taking this medication, and follow the directions you see here.   clopidogrel 75 MG tablet Commonly known as: PLAVIX Take 1 tablet (75  mg total) by mouth daily for 21 days. Start taking on: March 19, 2022   cyanocobalamin 1000 MCG tablet Commonly known as: VITAMIN B12 Take 1 tablet by mouth daily.   gabapentin 400 MG capsule Commonly known as: NEURONTIN Take 2 capsules (800 mg total) by mouth 3 (three) times daily. Replaces: gabapentin 800 MG tablet   hydrochlorothiazide 25 MG tablet Commonly known as: HYDRODIURIL Take 25 mg by mouth daily.   olmesartan 20 MG tablet Commonly known as: BENICAR Take 20 mg by mouth daily.   omeprazole 40 MG capsule Commonly known as: PRILOSEC Take 1 capsule (40 mg total) by mouth daily.   simvastatin 40 MG tablet Commonly known as: ZOCOR Take 1 tablet (40 mg total) by mouth daily at 6 PM. What changed:  medication strength how much to take when to take this   tiZANidine 2 MG tablet Commonly known as: ZANAFLEX Take 2 mg by mouth at bedtime.   venlafaxine XR 75 MG 24 hr capsule Commonly known as: EFFEXOR-XR Take 75 mg by mouth daily.               Durable Medical Equipment  (From admission, onward)           Start     Ordered   03/17/22 1011  For home use only DME Walker rolling  Once       Question Answer Comment  Walker:  With 5 Inch Wheels   Patient needs a walker to treat with the following condition Mobility impaired      03/17/22 1010            LABORATORY STUDIES CBC    Component Value Date/Time   WBC 7.2 03/17/2022 0229   RBC 3.88 (L) 03/17/2022 0229   HGB 12.2 (L) 03/17/2022 0229   HCT 35.7 (L) 03/17/2022 0229   PLT 138 (L) 03/17/2022 0229   MCV 92.0 03/17/2022 0229   MCH 31.4 03/17/2022 0229   MCHC 34.2 03/17/2022 0229   RDW 13.0 03/17/2022 0229   LYMPHSABS 2.0 03/15/2022 1859   MONOABS 0.6 03/15/2022 1859   EOSABS 0.1 03/15/2022 1859   BASOSABS 0.0 03/15/2022 1859   CMP    Component Value Date/Time   NA 144 03/17/2022 0229   K 3.9 03/17/2022 0229   CL 113 (H) 03/17/2022 0229   CO2 22 03/17/2022 0229   GLUCOSE 96  03/17/2022 0229   GLUCOSE 85 04/22/2006 0827   BUN 18 03/17/2022 0229   CREATININE 1.21 03/17/2022 0229   CALCIUM 8.5 (L) 03/17/2022 0229   PROT 6.7 03/16/2022 0224   ALBUMIN 3.5 03/16/2022 0224   AST 26 03/16/2022 0224   ALT 26 03/16/2022 0224   ALKPHOS 64 03/16/2022 0224   BILITOT 1.4 (H) 03/16/2022 0224   GFRNONAA >60 03/17/2022 0229   GFRAA 70 (L) 03/13/2013 1027   COAGS Lab Results  Component Value Date   INR 1.1 03/16/2022   INR 0.95 12/21/2010   Lipid Panel    Component Value Date/Time   CHOL 161 03/16/2022 0224   TRIG 99 03/16/2022 0224   TRIG 72 04/22/2006 0827   HDL 41 03/16/2022 0224   CHOLHDL 3.9 03/16/2022 0224   VLDL 20 03/16/2022 0224   LDLCALC 100 (H) 03/16/2022 0224   HgbA1C  Lab Results  Component Value Date   HGBA1C 5.3 03/15/2022   Urinalysis    Component Value Date/Time   COLORURINE YELLOW 03/16/2022 0320   APPEARANCEUR HAZY (A) 03/16/2022 0320   LABSPEC 1.042 (H) 03/16/2022 0320   PHURINE 5.0 03/16/2022 0320   GLUCOSEU NEGATIVE 03/16/2022 0320   HGBUR MODERATE (A) 03/16/2022 0320   HGBUR trace-intact 06/10/2009 0000   BILIRUBINUR NEGATIVE 03/16/2022 0320   BILIRUBINUR 1+ 07/16/2014 1156   KETONESUR NEGATIVE 03/16/2022 0320   PROTEINUR 30 (A) 03/16/2022 0320   UROBILINOGEN 0.2 07/16/2014 1156   UROBILINOGEN 1.0 12/21/2010 2220   NITRITE NEGATIVE 03/16/2022 0320   LEUKOCYTESUR NEGATIVE 03/16/2022 0320   Urine Drug Screen     Component Value Date/Time   LABOPIA NONE DETECTED 03/16/2022 0320   COCAINSCRNUR NONE DETECTED 03/16/2022 0320   LABBENZ NONE DETECTED 03/16/2022 0320   AMPHETMU NONE DETECTED 03/16/2022 0320   THCU NONE DETECTED 03/16/2022 0320   LABBARB NONE DETECTED 03/16/2022 0320    Alcohol Level    Component Value Date/Time   ETH <10 03/15/2022 1859     SIGNIFICANT DIAGNOSTIC STUDIES VAS [place] LOWER EXTREMITY VENOUS (DVT)  Result Date: 03/17/2022  Lower Venous DVT Study Patient Name:  Kurt Parsons  Date of  Exam:   03/17/2022 Medical Rec #: [id number]         Accession #:    [id number] Date of Birth: 1945-07-10          Patient Gender: M Patient Age:   76 years Exam Location:  [facility] Procedure:      VAS [place] LOWER EXTREMITY VENOUS (DVT) Referring Phys:  DENISE WOLFE --------------------------------------------------------------------------------  Indications: Stroke.  Comparison Study: No prior studies. Performing Technologist: Darlin Coco RDMS, RVT  Examination Guidelines: A complete evaluation includes B-mode imaging, spectral Doppler, color Doppler, and power Doppler as needed of all accessible portions of each vessel. Bilateral testing is considered an integral part of a complete examination. Limited examinations for reoccurring indications may be performed as noted. The reflux portion of the exam is performed with the patient in reverse Trendelenburg.  +---------+---------------+---------+-----------+----------+--------------+ RIGHT    CompressibilityPhasicitySpontaneityPropertiesThrombus Aging +---------+---------------+---------+-----------+----------+--------------+ CFV      Full           Yes      Yes                                 +---------+---------------+---------+-----------+----------+--------------+ SFJ      Full                                                        +---------+---------------+---------+-----------+----------+--------------+ FV Prox  Full                                                        +---------+---------------+---------+-----------+----------+--------------+ FV Mid   Full                                                        +---------+---------------+---------+-----------+----------+--------------+ FV DistalFull                                                        +---------+---------------+---------+-----------+----------+--------------+ PFV      Full                                                         +---------+---------------+---------+-----------+----------+--------------+ POP      Full           Yes      Yes                                 +---------+---------------+---------+-----------+----------+--------------+ PTV      Full                                                        +---------+---------------+---------+-----------+----------+--------------+ PERO     Full                                                        +---------+---------------+---------+-----------+----------+--------------+  Gastroc  Full                                                        +---------+---------------+---------+-----------+----------+--------------+   +---------+---------------+---------+-----------+----------+--------------+ LEFT     CompressibilityPhasicitySpontaneityPropertiesThrombus Aging +---------+---------------+---------+-----------+----------+--------------+ CFV      Full           Yes      Yes                                 +---------+---------------+---------+-----------+----------+--------------+ SFJ      Full                                                        +---------+---------------+---------+-----------+----------+--------------+ FV Prox  Full                                                        +---------+---------------+---------+-----------+----------+--------------+ FV Mid   Full                                                        +---------+---------------+---------+-----------+----------+--------------+ FV DistalFull                                                        +---------+---------------+---------+-----------+----------+--------------+ PFV      Full                                                        +---------+---------------+---------+-----------+----------+--------------+ POP      Full           Yes      Yes                                  +---------+---------------+---------+-----------+----------+--------------+ PTV      Full                                                        +---------+---------------+---------+-----------+----------+--------------+ PERO     Full                                                        +---------+---------------+---------+-----------+----------+--------------+  Gastroc  Full                                                        +---------+---------------+---------+-----------+----------+--------------+     Summary: RIGHT: - There is no evidence of deep vein thrombosis in the lower extremity.  - No cystic structure found in the popliteal fossa.  LEFT: - There is no evidence of deep vein thrombosis in the lower extremity.  - No cystic structure found in the popliteal fossa.  *See table(s) above for measurements and observations. Electronically signed by Harold Barban MD on 03/17/2022 at 9:30:14 PM.    Final    DG Knee 1-2 Views Right  Result Date: 03/17/2022 CLINICAL DATA:  Follow home.  Continued anterior right knee pain. EXAM: RIGHT KNEE - 1-2 VIEW COMPARISON:  None Available. FINDINGS: Minimal medial compartment joint space narrowing and peripheral degenerative spurring. Mild superior and inferior patellar degenerative osteophytosis. No joint effusion. No acute fracture or dislocation. IMPRESSION: Minimal medial and patellofemoral compartment osteoarthritis. Electronically Signed   By: Yvonne Kendall M.D.   On: 03/17/2022 12:52   MR BRAIN WO CONTRAST  Result Date: 03/16/2022 CLINICAL DATA:  Acute neurologic deficit EXAM: MRI HEAD WITHOUT CONTRAST TECHNIQUE: Multiplanar, multiecho pulse sequences of the brain and surrounding structures were obtained without intravenous contrast. COMPARISON:  None Available. FINDINGS: Brain: Small acute infarct of the left caudate head multiple small foci of acute ischemia the posterior left parietal cortex. No acute or chronic hemorrhage. Normal  white matter signal. Generalized volume loss. The midline structures are normal. Vascular: Major flow voids are preserved. Skull and upper cervical spine: Normal calvarium and skull base. Visualized upper cervical spine and soft tissues are normal. Sinuses/Orbits:No paranasal sinus fluid levels or advanced mucosal thickening. No mastoid or middle ear effusion. Normal orbits. IMPRESSION: Small acute infarct of the left caudate head and multiple small foci of acute ischemia the posterior left parietal cortex. No hemorrhage or mass effect. Electronically Signed   By: Ulyses Jarred M.D.   On: 03/16/2022 19:29   ECHOCARDIOGRAM COMPLETE  Result Date: 03/16/2022    ECHOCARDIOGRAM REPORT   Patient Name:   Kurt Parsons Date of Exam: 03/16/2022 Medical Rec #:  [id number]        Height:       70.0 in Accession #:    [id number]       Weight:       198.0 lb Date of Birth:  1946/01/06         BSA:          2.078 m Patient Age:    62 years         BP:           101/55 mmHg Patient Gender: M                HR:           56 bpm. Exam Location:  Inpatient Procedure: 2D Echo, Cardiac Doppler and Color Doppler Indications:    Stroke  History:        Patient has no prior history of Echocardiogram examinations.                 Risk Factors:Dyslipidemia and Hypertension.  Sonographer:    Memory Argue Referring Phys: [id number] [place]  1.  Left ventricular ejection fraction, by estimation, is 60 to 65%. The left ventricle has normal function. The left ventricle has no regional wall motion abnormalities. There is mild concentric left ventricular hypertrophy. Left ventricular diastolic parameters are consistent with Grade I diastolic dysfunction (impaired relaxation).  2. Right ventricular systolic function is normal. The right ventricular size is normal. There is normal pulmonary artery systolic pressure.  3. The mitral valve is normal in structure. No evidence of mitral valve regurgitation. No evidence of mitral  stenosis.  4. The aortic valve was not well visualized. Aortic valve regurgitation is not visualized. No aortic stenosis is present.  5. Aortic dilatation noted. There is mild dilatation of the aortic root, measuring 40 mm.  6. The inferior vena cava is normal in size with greater than 50% respiratory variability, suggesting right atrial pressure of 3 mmHg. Comparison(s): No prior Echocardiogram. Conclusion(s)/Recommendation(s): Normal biventricular function without evidence of hemodynamically significant valvular heart disease. FINDINGS  Left Ventricle: Left ventricular ejection fraction, by estimation, is 60 to 65%. The left ventricle has normal function. The left ventricle has no regional wall motion abnormalities. The left ventricular internal cavity size was normal in size. There is  mild concentric left ventricular hypertrophy. Left ventricular diastolic parameters are consistent with Grade I diastolic dysfunction (impaired relaxation). Right Ventricle: The right ventricular size is normal. No increase in right ventricular wall thickness. Right ventricular systolic function is normal. There is normal pulmonary artery systolic pressure. The tricuspid regurgitant velocity is 1.86 m/s, and  with an assumed right atrial pressure of 3 mmHg, the estimated right ventricular systolic pressure is 29.5 mmHg. Left Atrium: Left atrial size was normal in size. Right Atrium: Right atrial size was normal in size. Pericardium: There is no evidence of pericardial effusion. Mitral Valve: The mitral valve is normal in structure. No evidence of mitral valve regurgitation. No evidence of mitral valve stenosis. Tricuspid Valve: The tricuspid valve is normal in structure. Tricuspid valve regurgitation is not demonstrated. No evidence of tricuspid stenosis. Aortic Valve: The aortic valve was not well visualized. Aortic valve regurgitation is not visualized. No aortic stenosis is present. Aortic valve mean gradient measures 3.0 mmHg.  Aortic valve peak gradient measures 5.9 mmHg. Aortic valve area, by VTI measures 3.39 cm. Pulmonic Valve: The pulmonic valve was normal in structure. Pulmonic valve regurgitation is not visualized. No evidence of pulmonic stenosis. Aorta: Aortic dilatation noted. There is mild dilatation of the aortic root, measuring 40 mm. Venous: The inferior vena cava is normal in size with greater than 50% respiratory variability, suggesting right atrial pressure of 3 mmHg. IAS/Shunts: No atrial level shunt detected by color flow Doppler.  LEFT VENTRICLE PLAX 2D LVIDd:         4.30 cm   Diastology LVIDs:         3.00 cm   LV e' medial:    5.75 cm/s LV PW:         1.10 cm   LV E/e' medial:  9.1 LV IVS:        1.10 cm   LV e' lateral:   9.79 cm/s LVOT diam:     2.40 cm   LV E/e' lateral: 5.3 LV SV:         87 LV SV Index:   42 LVOT Area:     4.52 cm  RIGHT VENTRICLE TAPSE (M-mode): 1.7 cm LEFT ATRIUM           Index        RIGHT  ATRIUM           Index LA diam:      2.70 cm 1.30 cm/m   RA Area:     13.40 cm LA Vol (A2C): 54.5 ml 26.22 ml/m  RA Volume:   32.00 ml  15.40 ml/m LA Vol (A4C): 64.3 ml 30.94 ml/m  AORTIC VALVE AV Area (Vmax):    3.45 cm AV Area (Vmean):   3.39 cm AV Area (VTI):     3.39 cm AV Vmax:           121.00 cm/s AV Vmean:          76.000 cm/s AV VTI:            0.256 m AV Peak Grad:      5.9 mmHg AV Mean Grad:      3.0 mmHg LVOT Vmax:         92.40 cm/s LVOT Vmean:        56.900 cm/s LVOT VTI:          0.192 m LVOT/AV VTI ratio: 0.75  AORTA Ao Root diam: 4.00 cm MITRAL VALVE               TRICUSPID VALVE MV Area (PHT): 2.54 cm    TR Peak grad:   13.8 mmHg MV Decel Time: 299 msec    TR Vmax:        186.00 cm/s MV E velocity: 52.10 cm/s MV A velocity: 75.10 cm/s  SHUNTS MV E/A ratio:  0.69        Systemic VTI:  0.19 m                            Systemic Diam: 2.40 cm Rudean Haskell MD Electronically signed by Rudean Haskell MD Signature Date/Time: 03/16/2022/9:01:43 AM    Final    CT ANGIO  HEAD NECK W WO CM (CODE STROKE)  Addendum Date: 03/15/2022   ADDENDUM REPORT: 03/15/2022 19:37 ADDENDUM: There is 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 Discussed with Dr. Curly Shores 7:36 p.m. on 03/15/2022. Electronically Signed   By: Ulyses Jarred M.D.   On: 03/15/2022 19:37   Result Date: 03/15/2022 CLINICAL DATA:  Bilateral weakness. EXAM: CT ANGIOGRAPHY HEAD AND NECK TECHNIQUE: Multidetector CT imaging of the head and neck was performed using the standard protocol during bolus administration of intravenous contrast. Multiplanar CT image reconstructions and MIPs were obtained to evaluate the vascular anatomy. Carotid stenosis measurements (when applicable) are obtained utilizing NASCET criteria, using the distal internal carotid diameter as the denominator. RADIATION DOSE REDUCTION: This exam was performed according to the departmental dose-optimization program which includes automated exposure control, adjustment of the mA and/or kV according to patient size and/or use of iterative reconstruction technique. CONTRAST:  75 mL Omnipaque 350 COMPARISON:  None Available. FINDINGS: CTA NECK FINDINGS SKELETON: There is no bony spinal canal stenosis. No lytic or blastic lesion. C5-7 ACDF. OTHER NECK: Normal pharynx, larynx and major salivary glands. No cervical lymphadenopathy. Unremarkable thyroid gland. UPPER CHEST: No pneumothorax or pleural effusion. No nodules or masses. AORTIC ARCH: There is calcific atherosclerosis of the aortic arch. There is no aneurysm, dissection or hemodynamically significant stenosis of the visualized portion of the aorta. Conventional 3 vessel aortic branching pattern. The visualized proximal subclavian arteries are widely patent. RIGHT CAROTID SYSTEM: No dissection, occlusion or aneurysm. Mild atherosclerotic calcification at the  carotid bifurcation without hemodynamically significant stenosis. LEFT CAROTID SYSTEM:  Normal without aneurysm, dissection or stenosis. VERTEBRAL ARTERIES: Left dominant configuration. Both origins are clearly patent. There is no dissection, occlusion or flow-limiting stenosis to the skull base (V1-V3 segments). CTA HEAD FINDINGS POSTERIOR CIRCULATION: --Vertebral arteries: Normal V4 segments. --Inferior cerebellar arteries: Normal. --Basilar artery: Normal. --Superior cerebellar arteries: Normal. --Posterior cerebral arteries (PCA): Normal. ANTERIOR CIRCULATION: --Intracranial internal carotid arteries: Normal. --Anterior cerebral arteries (ACA): Normal. Both A1 segments are present. Patent anterior communicating artery (a-comm). --Middle cerebral arteries (MCA): Normal. VENOUS SINUSES: As permitted by contrast timing, patent. ANATOMIC VARIANTS: Fetal origin of the right posterior cerebral artery. Review of the MIP images confirms the above findings. IMPRESSION: 1. No emergent large vessel occlusion or hemodynamically significant stenosis of the head or neck. 2. Mild right carotid bifurcation atherosclerosis without hemodynamically significant stenosis. Aortic atherosclerosis (ICD10-I70.0). Electronically Signed: By: Ulyses Jarred M.D. On: 03/15/2022 19:14   CT HEAD CODE STROKE WO CONTRAST  Result Date: 03/15/2022 CLINICAL DATA:  Code stroke.  Bilateral weakness EXAM: CT HEAD WITHOUT CONTRAST TECHNIQUE: Contiguous axial images were obtained from the base of the skull through the vertex without intravenous contrast. RADIATION DOSE REDUCTION: This exam was performed according to the departmental dose-optimization program which includes automated exposure control, adjustment of the mA and/or kV according to patient size and/or use of iterative reconstruction technique. COMPARISON:  None Available. FINDINGS: Brain: There is no mass, hemorrhage or extra-axial collection. The size and configuration of the ventricles and extra-axial CSF spaces are normal. The brain parenchyma is normal, without evidence  of acute or chronic infarction. Vascular: No abnormal hyperdensity of the major intracranial arteries or dural venous sinuses. No intracranial atherosclerosis. Skull: The visualized skull base, calvarium and extracranial soft tissues are normal. Sinuses/Orbits: No fluid levels or advanced mucosal thickening of the visualized paranasal sinuses. No mastoid or middle ear effusion. The orbits are normal. ASPECTS [facility] Stroke Program Early CT Score) - Ganglionic level infarction (caudate, lentiform nuclei, internal capsule, insula, M1-M3 cortex): 7 - Supraganglionic infarction (M4-M6 cortex): 3 Total score (0-10 with 10 being normal): 10 IMPRESSION: 1. No acute intracranial abnormality. 2. ASPECTS is 10. These results were communicated to Dr. Lesleigh Noe at 7:04 pm on 03/15/2022 by text page via the [facility] messaging system. Electronically Signed   By: Ulyses Jarred M.D.   On: 03/15/2022 19:09      HISTORY OF PRESENT ILLNESS  Kurt Parsons is a 76 y.o. male with a medical history significant for essential hypertension, hyperlipidemia, obesity, nonischemic cell lung cancer s/p resection (03/2021), C5-C6 posterior cervical decompression (1994), C6 5-C7 ACDF (left side, 19 95/1996) who presented to the ED on 10/9 via EMS for evaluation of right-sided weakness and speech disturbance. Per patient's wife patient was in his usual state of health at 17:00 working on his computer using both upper extremities equally and drinking ginger ale but at 17:15 the patient began to seem confused with a right facial droop, was leaning towards the right on ambulating with right upper extremity weakness as well and EMS was activated. Patient's wife did note that the patient had nausea, vomiting, and diaphoresis overnight last night but had a ginger ale today and some rice and told his wife that he was feeling "much better". During EMS transport today, the patient became unresponsive with EMS and was not following commands. Patient  was subsequently activated as a code stroke for further evaluation.   HOSPITAL COURSE Patient was taken directly to CT SCAN  Suite and his acute onset aphasia was concerning for stroke . His NIHSS was calculated as 19. CT SCAN Head revealed no acute pathology.CTA Head/Neck revealed multifocal severe stenosis and short segment occlusion of the left MCA M2 branches and given his hypotension with significant stenosis in thel left MCA territory there was concern for symptomatic hypotension vs atheroembolic stroke. Last wellknown was @ 1700 and patient was administered TNK @ 1914. NO IR Thrombectomy due to No LVO (Small M2/M3 occlusion which is to remote for interention)  Patients neurological examination improved and he was transferred to NSICU for TNK administration/Monitoring Patient with expressive aphasia and receptive language deficits which  frustrated him and improved in next 48 hours MRI Brain (non-contrast) was obtained (03/16/22) revealing small acute of left caudate head and multiple small foci of acute ischemia '@posterior'$  left parietal cortex w/o hemorrhage or mass effect. Patient neurological exam and speech has been improving . He completed rest of his stroke workup including Echocardiogram showed LVEF @ 60-65% and no other abnormality.He has been placed on stroke risk factor modifications to include DAPT ASA 81 mg + PLAVIX 75 mg q day. LDL eleated @ 100 and home Zocor dosage increased to 40 mg qhs. Lower extremity Dopplers were also negative.  He also c/o right knee pain and xray showed osteoarthritis and no acute findings. Patient has been working with SLT/PT/OT and recommending Home Health Treatment   RN Pressure Injury Documentation:     DISCHARGE EXAM Blood pressure 125/89, pulse 61, temperature 98.8 F (37.1 C), temperature source Oral, resp. rate 18, weight 89.8 kg, SpO2 97 %. NEURO:  Patient in bed eating breakfast. No family member present  Mental Status: AA&Ox3 , follows  commands  knows reasoning being hospitalized Language: speech with expressive deficits such as calling objects in primary language or @ times just responding yes/no.He also perseverates with certain direct questions.  He can name objects shown to him and most objects were correct. Naming, repetition, fluency, and comprehension intact. Cranial Nerves: PERRL 3 mm  EOMI, visual fields full, no facial asymmetry, facial sensation intact, hearing intact, tongue/uvula/soft palate midline, normal sternocleidomastoid and trapezius muscle strength. No evidence of tongue atrophy or fibrillations Motor: Moves all extremity through out  RUE 5/5     RLE 5/5     R Grip 5/5 LUE  5-/5    LLE 5-/5    L Grip 4/5  Tone: is normal and bulk is normal Sensation- Intact to light touch bilaterally Coordination: FTN intact bilaterally, no ataxia in BLE. Gait- deferred  Discharge Diet       Diet   Diet regular Room service appropriate? Yes with Assist; Fluid consistency: Thin   liquids  DISCHARGE PLAN Disposition:  Discharge Home  Aspirin 81 mg po qday + Plavix 75 mg po qday  for secondary stroke prevention for 3 weeks  then Aspirin  alone. Ongoing stroke risk factor control by Primary Care Physician at time of discharge Follow-up PCP Loni Muse, MD in 2 weeks. Follow-up in [facility] with stroke nurse practitioner in 8 weeks, office to schedule an appointment.  No Driving Automobile until cleared by Neurology Clinic No Baths @ this time, May take shower with assistance first week home    45 minutes were spent preparing discharge.   I have personally obtained history,examined this patient, reviewed notes, independently viewed imaging studies, participated in medical decision making and plan of care.ROS completed by me personally and pertinent positives fully documented  I have  made any additions or clarifications directly to the above note. Agree with note above.    Antony Contras, MD Medical Director [facility] Pager: [phone] 03/18/2022 4:22 PM

## 2022-03-18 NOTE — Care Management Important Message (Signed)
Important Message  Patient Details  Name: Kurt Parsons MRN: [id number] Date of Birth: [birth year]   Medicare Important Message Given:  Yes     Hannah Beat 03/18/2022, 2:02 PM

## 2022-03-18 NOTE — Discharge Instructions (Signed)
Care After Your Loop Recorder  You have a __ Loop Recorder   Monitor your cardiac device site for redness, swelling, and drainage. Call the device clinic at [phone] if you experience these symptoms or fever/chills.  If you notice bleeding from your site, hold firm, but gently pressure with two fingers for 5 minutes. Dried blood on the steri-strips when removing the outer bandage is normal.   Keep the large square bandage on your site for 24 hours and then you may remove it yourself. Keep the steri-strips underneath in place.   You may shower after 72 hours / 3 days from your procedure with the steri-strips in place. They will usually fall off on their own, or may be removed after 10 days. Pat dry.   Avoid lotions, ointments, or perfumes over your incision until it is well-healed.  Please do not submerge in water until your site is completely healed.   Your device is MRI compatible.   Remote monitoring is used to monitor your cardiac device from home. This monitoring is scheduled every month by our office. It allows [place] to keep an eye on the function of your device to ensure it is working properly.

## 2022-03-19 ENCOUNTER — Telehealth: Payer: Self-pay

## 2022-03-19 ENCOUNTER — Encounter (HOSPITAL_COMMUNITY): Payer: Self-pay | Admitting: Cardiology

## 2022-03-19 NOTE — Telephone Encounter (Signed)
  Loop Recorder Follow up   Is patient connected to [facility]? Yes   Have steri-strips fallen off or been removed?  TBD  Does the patient need in office follow up? No   Please continue to monitor your cardiac device site for redness, swelling, and drainage. Call the device clinic at [phone] if you experience these symptoms, fever/chills, or have questions about your device.   Remote monitoring is used to monitor your cardiac device from home. This monitoring is scheduled every month by our office. It allows [place] to keep an eye on the functioning of your device to ensure it is working properly.   Attempted to contact patient. No answer, LMTCB.

## 2022-03-19 NOTE — Telephone Encounter (Signed)
-----   Message from Mamie Levers, NP sent at 03/18/2022 10:23 AM EDT ----- Loop today, going home today. Dr. Quentin Ore

## 2022-03-23 NOTE — Telephone Encounter (Signed)
Left detailed 2nd message.  Will send mychart message.    Await further needs.

## 2022-04-19 ENCOUNTER — Ambulatory Visit: Payer: Medicare Other | Attending: Internal Medicine

## 2022-05-05 NOTE — Progress Notes (Unsigned)
PATIENT: Kurt Parsons DOB: [birth year]  REASON FOR VISIT: follow up HISTORY FROM: patient PRIMARY NEUROLOGIST: Dr. Leonie Man  No chief complaint on file.    HISTORY OF PRESENT ILLNESS: Today Kurt Parsons is a 76 y.o. male with history of Left MCA embolic infarct. Returns today for follow-up.   Loop recorder  HISTORY Kurt Parsons is a 76 y.o. male with a medical history significant for essential hypertension, hyperlipidemia, obesity, nonischemic cell lung cancer s/p resection (03/2021), C5-C6 posterior cervical decompression (1994), C6 5-C7 ACDF (left side, 19 95/1996) who presented to the ED on 10/9 via EMS for evaluation of right-sided weakness and speech disturbance. Per patient's wife patient was in his usual state of health at 17:00 working on his computer using both upper extremities equally and drinking ginger ale but at 17:15 the patient began to seem confused with a right facial droop, was leaning towards the right on ambulating with right upper extremity weakness as well and EMS was activated. Patient's wife did note that the patient had nausea, vomiting, and diaphoresis overnight last night but had a ginger ale today and some rice and told his wife that he was feeling "much better". During EMS transport today, the patient became unresponsive with EMS and was not following commands. Patient was subsequently activated as a code stroke for further evaluation.    Patient was taken directly to [place] and his acute onset aphasia was concerning for stroke . His NIHSS was calculated as 19. CT SCAN Head revealed no acute pathology.CTA Head/Neck revealed multifocal severe stenosis and short segment occlusion of the left MCA M2 branches and given his hypotension with significant stenosis in thel left MCA territory there was concern for symptomatic hypotension vs atheroembolic stroke. Last wellknown was @ 1700 and patient was administered TNK @ 1914. NO IR Thrombectomy due to No  LVO (Small M2/M3 occlusion which is to remote for interention)  Patients neurological examination improved and he was transferred to NSICU for TNK administration/Monitoring Patient with expressive aphasia and receptive language deficits which  frustrated him and improved in next 48 hours MRI Brain (non-contrast) was obtained (03/16/22) revealing small acute of left caudate head and multiple small foci of acute ischemia '@posterior'$  left parietal cortex w/o hemorrhage or mass effect. Patient neurological exam and speech has been improving . He completed rest of his stroke workup including Echocardiogram showed LVEF @ 60-65% and no other abnormality.He has been placed on stroke risk factor modifications to include DAPT ASA 81 mg + PLAVIX 75 mg q day. LDL eleated @ 100 and home Zocor dosage increased to 40 mg qhs. Lower extremity Dopplers were also negative.  He also c/o right knee pain and xray showed osteoarthritis and no acute findings. Patient has been working with SLT/PT/OT and recommending Home Health Treatment REVIEW OF SYSTEMS: Out of a complete 14 system review of symptoms, the patient complains only of the following symptoms, and all other reviewed systems are negative.  ALLERGIES: Allergies  Allergen Reactions   Zestril [Lisinopril] Swelling and Other (See Comments)    Angioedema   Diazepam Other (See Comments)    Makes him loopy and out of it.   Lactose Intolerance (Gi) Diarrhea    HOME MEDICATIONS: Outpatient Medications Prior to Visit  Medication Sig Dispense Refill   amLODipine (NORVASC) 10 MG tablet Take 1 tablet by mouth daily.     aspirin 81 MG chewable tablet Chew 1 tablet (81 mg total) by mouth daily. 30 tablet 0  celecoxib (CELEBREX) 100 MG capsule Take 100 mg by mouth 2 (two) times daily.     cyanocobalamin (VITAMIN B12) 1000 MCG tablet Take 1 tablet by mouth daily.     gabapentin (NEURONTIN) 400 MG capsule Take 2 capsules (800 mg total) by mouth 3 (three) times daily. 180  capsule 0   hydrochlorothiazide (HYDRODIURIL) 25 MG tablet Take 25 mg by mouth daily.     olmesartan (BENICAR) 20 MG tablet Take 20 mg by mouth daily.     omeprazole (PRILOSEC) 40 MG capsule Take 1 capsule (40 mg total) by mouth daily. 100 capsule 3   simvastatin (ZOCOR) 40 MG tablet Take 1 tablet (40 mg total) by mouth daily at 6 PM. 30 tablet 1   tiZANidine (ZANAFLEX) 2 MG tablet Take 2 mg by mouth at bedtime.     venlafaxine XR (EFFEXOR-XR) 75 MG 24 hr capsule Take 75 mg by mouth daily. (Patient not taking: Reported on 03/16/2022)     No facility-administered medications prior to visit.    PAST MEDICAL HISTORY: Past Medical History:  Diagnosis Date   Back pain    Chronic pain syndrome    takes Morphine daily   Constipation    takes Colace daily   Dizziness    occasionally   ED (erectile dysfunction)    takes Celexa daily   Gallbladder sludge    GERD (gastroesophageal reflux disease)    takes Omeprazole daily   Glaucoma    mild per pt   Hyperlipidemia    takes Zocor nightly   Hypertension    takes Lisinopril daily   Inguinal hernia    Internal hemorrhoids    Lazy eye rt eye   Liver cyst    Neck pain    Neuropathy    Right hydrocele    Sleep disturbance    takes Ambien prn   Ulcer     PAST SURGICAL HISTORY: Past Surgical History:  Procedure Laterality Date   BACK SURGERY     bilateral cataract removed     CERVICAL DISC SURGERY     x 2   COLONOSCOPY     HEMORRHOID SURGERY     INGUINAL HERNIA REPAIR Right 03/21/2013   Procedure: HERNIA REPAIR INGUINAL ADULT;  Surgeon: Imogene Burn. Georgette Dover, MD;  Location: [place];  Service: General;  Laterality: Right;   INSERTION OF MESH Right 03/21/2013   Procedure: INSERTION OF MESH;  Surgeon: Imogene Burn. Georgette Dover, MD;  Location: [place];  Service: General;  Laterality: Right;   LOOP RECORDER INSERTION N/A 03/18/2022   Procedure: LOOP RECORDER INSERTION;  Surgeon: Vickie Epley, MD;  Location: [place] [facility];  Service:  Cardiovascular;  Laterality: N/A;   LUMBAR DISC SURGERY     x 2   NECK SURGERY      FAMILY HISTORY: Family History  Problem Relation Age of Onset   Diabetes Other    Hypertension Other    Stroke Other    Heart disease Other    COPD Other     SOCIAL HISTORY: Social History   Socioeconomic History   Marital status: Married    Spouse name: Not on file   Number of children: 1   Years of education: Not on file   Highest education level: Not on file  Occupational History   Occupation: retired    Fish farm manager: UNEMPLOYED  Tobacco Use   Smoking status: Former   Smokeless tobacco: Never   Tobacco comments:    quit in 1976  Substance  and Sexual Activity   Alcohol use: No    Comment: quit in 1989   Drug use: No   Sexual activity: Yes  Other Topics Concern   Not on file  Social History Narrative   Not on file   Social Determinants of Health   Financial Resource Strain: Not on file  Food Insecurity: Not on file  Transportation Needs: Not on file  Physical Activity: Not on file  Stress: Not on file  Social Connections: Not on file  Intimate Partner Violence: Not on file      PHYSICAL EXAM  There were no vitals filed for this visit. There is no height or weight on file to calculate BMI.  Generalized: Well developed, in no acute distress   Neurological examination  Mentation: Alert oriented to time, place, history taking. Follows all commands speech and language fluent Cranial nerve II-XII: Pupils were equal round reactive to light. Extraocular movements were full, visual field were full on confrontational test. Facial sensation and strength were normal. Uvula tongue midline. Head turning and shoulder shrug  were normal and symmetric. Motor: The motor testing reveals 5 over 5 strength of all 4 extremities. Good symmetric motor tone is noted throughout.  Sensory: Sensory testing is intact to soft touch on all 4 extremities. No evidence of extinction is noted.   Coordination: Cerebellar testing reveals good finger-nose-finger and heel-to-shin bilaterally.  Gait and station: Gait is normal. Tandem gait is normal. Romberg is negative. No drift is seen.  Reflexes: Deep tendon reflexes are symmetric and normal bilaterally.   DIAGNOSTIC DATA (LABS, IMAGING, TESTING) - I reviewed patient records, labs, notes, testing and imaging myself where available.  Lab Results  Component Value Date   WBC 7.2 03/17/2022   HGB 12.2 (L) 03/17/2022   HCT 35.7 (L) 03/17/2022   MCV 92.0 03/17/2022   PLT 138 (L) 03/17/2022      Component Value Date/Time   NA 144 03/17/2022 0229   K 3.9 03/17/2022 0229   CL 113 (H) 03/17/2022 0229   CO2 22 03/17/2022 0229   GLUCOSE 96 03/17/2022 0229   GLUCOSE 85 04/22/2006 0827   BUN 18 03/17/2022 0229   CREATININE 1.21 03/17/2022 0229   CALCIUM 8.5 (L) 03/17/2022 0229   PROT 6.7 03/16/2022 0224   ALBUMIN 3.5 03/16/2022 0224   AST 26 03/16/2022 0224   ALT 26 03/16/2022 0224   ALKPHOS 64 03/16/2022 0224   BILITOT 1.4 (H) 03/16/2022 0224   GFRNONAA >60 03/17/2022 0229   GFRAA 70 (L) 03/13/2013 1027   Lab Results  Component Value Date   CHOL 161 03/16/2022   HDL 41 03/16/2022   LDLCALC 100 (H) 03/16/2022   TRIG 99 03/16/2022   CHOLHDL 3.9 03/16/2022   Lab Results  Component Value Date   HGBA1C 5.3 03/15/2022   No results found for: "VITAMINB12" Lab Results  Component Value Date   TSH 2.12 07/16/2014      ASSESSMENT AND PLAN 76 y.o. year old male  has a past medical history of Back pain, Chronic pain syndrome, Constipation, Dizziness, ED (erectile dysfunction), Gallbladder sludge, GERD (gastroesophageal reflux disease), Glaucoma, Hyperlipidemia, Hypertension, Inguinal hernia, Internal hemorrhoids, Lazy eye (rt eye), Liver cyst, Neck pain, Neuropathy, Right hydrocele, Sleep disturbance, and Ulcer. here with ***  Stroke: Left MCA embolic infarct  etiology:    Continue aspirin 81 mg daily  for secondary stroke  prevention.  Discussed secondary stroke prevention measures and importance of close PCP follow up for aggressive stroke risk  factor management. I have gone over the pathophysiology of stroke, warning signs and symptoms, risk factors and their management in some detail with instructions to go to the closest emergency room for symptoms of concern. HTN: BP goal <130/90.  HLD: LDL goal <70. Recent LDL 100.  DMII: A1c goal<7.0. Recent A1c 5.3.  Encouraged patient to monitor diet and encouraged exercise FU with our office PRN    Ward Givens, MSN, NP-C 05/05/2022, 3:59 PM [facility] [address] [place], [place] [address] [phone]

## 2022-05-06 ENCOUNTER — Encounter: Payer: Self-pay | Admitting: Adult Health

## 2022-05-06 ENCOUNTER — Ambulatory Visit: Payer: Medicare Other | Admitting: Adult Health

## 2022-05-06 VITALS — BP 160/81 | HR 61 | Ht 71.0 in | Wt 200.2 lb

## 2022-05-06 DIAGNOSIS — I63412 Cerebral infarction due to embolism of left middle cerebral artery: Secondary | ICD-10-CM

## 2022-05-06 NOTE — Patient Instructions (Signed)
Your Plan:  Continue Plavix 75 mg daily Blood pressure goal <130/90 Cholesterol LDL goal <70 Diabetes goal A1c <7 Monitor diet and try to exercise . Continue doing speech therapy exercises at home Plavix will need to be stopped 3 to 5 days prior to procedure  Thank you for coming to see [place] at [facility]. I hope we have been able to provide you high quality care today.  You may receive a patient satisfaction survey over the next few weeks. We would appreciate your feedback and comments so that we may continue to improve ourselves and the health of our patients.

## 2022-06-21 ENCOUNTER — Ambulatory Visit (INDEPENDENT_AMBULATORY_CARE_PROVIDER_SITE_OTHER): Payer: Medicare Other

## 2022-06-21 DIAGNOSIS — I639 Cerebral infarction, unspecified: Secondary | ICD-10-CM | POA: Diagnosis not present

## 2022-06-23 LAB — CUP PACEART REMOTE DEVICE CHECK
Date Time Interrogation Session: 20240116165306
Implantable Pulse Generator Implant Date: 20231012
Pulse Gen Serial Number: 511014813

## 2022-06-28 ENCOUNTER — Other Ambulatory Visit: Payer: Self-pay

## 2022-06-28 NOTE — Patient Outreach (Signed)
First telephone outreach attempt to obtain mRS. No answer. Left message for returned call.  Chad Donoghue [facility]-Care Management Assistant [phone]  

## 2022-06-29 ENCOUNTER — Other Ambulatory Visit: Payer: Self-pay

## 2022-06-29 NOTE — Patient Outreach (Signed)
Second telephone outreach attempt to obtain mRS. No answer. Left message for returned call.  Placida Cambre [facility]-Care Management Assistant [phone]  

## 2022-07-01 ENCOUNTER — Other Ambulatory Visit: Payer: Self-pay

## 2022-07-01 NOTE — Patient Outreach (Signed)
3 outreach attempts were completed to obtain mRs. mRs could not be obtained because patient never returned my calls. mRs=7    Kurt Parsons Care Management Assistant [phone]  

## 2022-07-21 ENCOUNTER — Telehealth: Payer: Self-pay

## 2022-07-21 NOTE — Telephone Encounter (Signed)
Strips reviewed by Dr. Quentin Ore.    Will continue to monitor d/t brevity of episodes.  Possible atrial tachycardia.  Will await call back to confirm Pt not symptomatic.

## 2022-07-21 NOTE — Telephone Encounter (Signed)
Alert received from [employer]:  ILR alert, 3 new tachy events  Strips reviewed with assistance of industry.  Representative believes this is a rapid ventricular response to an atrial arrhythmia.  Attempted to contact Pt to assess symptoms.  Left message requesting call back.  Will forward to implanting doctor for review/advisement.

## 2022-07-22 ENCOUNTER — Ambulatory Visit (INDEPENDENT_AMBULATORY_CARE_PROVIDER_SITE_OTHER): Payer: Medicare Other

## 2022-07-22 DIAGNOSIS — I639 Cerebral infarction, unspecified: Secondary | ICD-10-CM

## 2022-07-24 LAB — CUP PACEART REMOTE DEVICE CHECK
Date Time Interrogation Session: 20240217132855
Implantable Pulse Generator Implant Date: 20231012
Pulse Gen Serial Number: 511014813

## 2022-07-29 NOTE — Progress Notes (Signed)
Merlin Loop [employer]

## 2022-08-18 NOTE — Progress Notes (Signed)
[facility] Summary Report / Loop Recorder 

## 2022-08-23 ENCOUNTER — Ambulatory Visit (INDEPENDENT_AMBULATORY_CARE_PROVIDER_SITE_OTHER): Payer: Medicare Other

## 2022-08-23 DIAGNOSIS — I639 Cerebral infarction, unspecified: Secondary | ICD-10-CM | POA: Diagnosis not present

## 2022-08-23 LAB — CUP PACEART REMOTE DEVICE CHECK
Date Time Interrogation Session: 20240314143246
Implantable Pulse Generator Implant Date: 20231012
Pulse Gen Serial Number: 511014813

## 2022-09-20 ENCOUNTER — Telehealth: Payer: Self-pay

## 2022-09-20 NOTE — Telephone Encounter (Signed)
Routing to Dr. Lalla Brothers to view if AF is present. Duration 2 minutes 44 seconds.ILR implanted for CVA.

## 2022-09-22 LAB — CUP PACEART REMOTE DEVICE CHECK
Date Time Interrogation Session: 20240417100645
Implantable Pulse Generator Implant Date: 20231012
Pulse Gen Serial Number: 511014813

## 2022-09-23 ENCOUNTER — Ambulatory Visit (INDEPENDENT_AMBULATORY_CARE_PROVIDER_SITE_OTHER): Payer: Medicare Other

## 2022-09-23 DIAGNOSIS — I639 Cerebral infarction, unspecified: Secondary | ICD-10-CM

## 2022-09-29 NOTE — Progress Notes (Signed)
Merlin Loop Recorder 

## 2022-10-11 ENCOUNTER — Telehealth: Payer: Self-pay

## 2022-10-11 NOTE — Telephone Encounter (Signed)
Following alert received from [employer] received for ILR alert for false AF, EGM shows SR with ectopy. Programmed "balanced" route to triage consider programming "less" or "least" to decrease non-actionable alerts.  Dr. Lalla Brothers, would you be okay if we make programming changes requested by [employer]?

## 2022-10-12 NOTE — Telephone Encounter (Signed)
Programmed to least sensitive in [employer].

## 2022-10-18 ENCOUNTER — Telehealth: Payer: Self-pay

## 2022-10-18 NOTE — Telephone Encounter (Signed)
Following alert received from [employer] received for 2 of the 4 events appear likely AF, longest duration 48sec mean HR's 118-121. 2 events appear SR with first degree AVB with ectopy. Burden 1%, no OAC per EPIC.  Routing to Dr. Lalla Brothers for review prior to sending to AF clinic d/t frequent false AF events logged. Recently made adjustments "least" to AF sensitivity.

## 2022-10-25 ENCOUNTER — Ambulatory Visit (INDEPENDENT_AMBULATORY_CARE_PROVIDER_SITE_OTHER): Payer: Medicare Other

## 2022-10-25 DIAGNOSIS — I639 Cerebral infarction, unspecified: Secondary | ICD-10-CM | POA: Diagnosis not present

## 2022-10-25 LAB — CUP PACEART REMOTE DEVICE CHECK
Date Time Interrogation Session: 20240519023650
Implantable Pulse Generator Implant Date: 20231012
Pulse Gen Serial Number: 511014813

## 2022-10-25 NOTE — Progress Notes (Signed)
[facility] Summary Report / Loop Recorder 

## 2022-11-17 ENCOUNTER — Ambulatory Visit: Payer: Medicare Other | Admitting: Adult Health

## 2022-11-17 ENCOUNTER — Encounter: Payer: Self-pay | Admitting: Adult Health

## 2022-11-17 VITALS — BP 116/74 | HR 59 | Ht 71.0 in | Wt 194.0 lb

## 2022-11-17 DIAGNOSIS — I63412 Cerebral infarction due to embolism of left middle cerebral artery: Secondary | ICD-10-CM

## 2022-11-17 NOTE — Patient Instructions (Signed)
Your Plan:  Continue plavix  Blood pressure goal <130/90 Cholesterol LDL goal <70 Diabetes goal A1c <7 Monitor diet and try to exercise   Thank you for coming to see [place] at [facility]. I hope we have been able to provide you high quality care today.  You may receive a patient satisfaction survey over the next few weeks. We would appreciate your feedback and comments so that we may continue to improve ourselves and the health of our patients.

## 2022-11-17 NOTE — Progress Notes (Signed)
PATIENT: Kurt Parsons DOB: July 10, 1945  REASON FOR VISIT: follow up HISTORY FROM: patient PRIMARY NEUROLOGIST: Dr. Pearlean Brownie  Chief Complaint  Patient presents with   RM 8    Patient is here alone for stroke follow-up. He reports continued improvement in his symptoms post stroke. He still has some trouble with his speech however. His loop recorder is still present.     HISTORY OF PRESENT ILLNESS: Today 11/17/22:  Kurt Parsons is a 77 y.o. male with a history of left MCA embolic infarct. Returns today for follow-up.  Overall he feels that he continues to improve.  Still has some trouble with his speech.  Remains on Plavix.  Denies any additional strokelike symptoms.  Has a loop recorder but has not been told of any abnormal rhythms.  Continues to follow-up with his primary care provider.   05/06/22: Kurt Parsons is a 77 y.o. male with history of Left MCA embolic infarct. Returns today for follow-up.  He reports physically he has not had any deficits since the stroke.  His wife reports that she has noticed some trouble with his speech and cognition.  He did go through speech therapy.  She reports that he  Speaks 3 languages- having a hard time mixing up the different lanuages. Trouble with short- term memory.  Remains on Plavix.  Did have a loop recorder placed while in the hospital.  Primary care is managing cholesterol and hypertension.  He does report that he plans to have nerve block in the cervical spine performed in the next several weeks.  HISTORY Kurt Parsons is a 77 y.o. male with a medical history significant for essential hypertension, hyperlipidemia, obesity, nonischemic cell lung cancer s/p resection (03/2021), C5-C6 posterior cervical decompression (1994), C6 5-C7 ACDF (left side, 19 95/1996) who presented to the ED on 10/9 via EMS for evaluation of right-sided weakness and speech disturbance. Per patient's wife patient was in his usual state of health at 17:00  working on his computer using both upper extremities equally and drinking ginger ale but at 17:15 the patient began to seem confused with a right facial droop, was leaning towards the right on ambulating with right upper extremity weakness as well and EMS was activated. Patient's wife did note that the patient had nausea, vomiting, and diaphoresis overnight last night but had a ginger ale today and some rice and told his wife that he was feeling "much better". During EMS transport today, the patient became unresponsive with EMS and was not following commands. Patient was subsequently activated as a code stroke for further evaluation.    Patient was taken directly to CT SCAN Suite and his acute onset aphasia was concerning for stroke . His NIHSS was calculated as 19. CT SCAN Head revealed no acute pathology.CTA Head/Neck revealed multifocal severe stenosis and short segment occlusion of the left MCA M2 branches and given his hypotension with significant stenosis in thel left MCA territory there was concern for symptomatic hypotension vs atheroembolic stroke. Last wellknown was @ 1700 and patient was administered TNK @ 1914. NO IR Thrombectomy due to No LVO (Small M2/M3 occlusion which is to remote for interention)  Patients neurological examination improved and he was transferred to NSICU for TNK administration/Monitoring Patient with expressive aphasia and receptive language deficits which  frustrated him and improved in next 48 hours MRI Brain (non-contrast) was obtained (03/16/22) revealing small acute of left caudate head and multiple small foci of acute ischemia @posterior  left parietal cortex  w/o hemorrhage or mass effect. Patient neurological exam and speech has been improving . He completed rest of his stroke workup including Echocardiogram showed LVEF @ 60-65% and no other abnormality.He has been placed on stroke risk factor modifications to include DAPT ASA 81 mg + PLAVIX 75 mg q day. LDL eleated @  100 and home Zocor dosage increased to 40 mg qhs. Lower extremity Dopplers were also negative.  He also c/o right knee pain and xray showed osteoarthritis and no acute findings. Patient has been working with [facility]/PT/OT and recommending Home Health Treatment REVIEW OF SYSTEMS: Out of a complete 14 system review of symptoms, the patient complains only of the following symptoms, and all other reviewed systems are negative.  ALLERGIES: Allergies  Allergen Reactions   Zestril [Lisinopril] Swelling and Other (See Comments)    Angioedema   Diazepam Other (See Comments)    Makes him loopy and out of it.   Lactose Intolerance (Gi) Diarrhea    HOME MEDICATIONS: Outpatient Medications Prior to Visit  Medication Sig Dispense Refill   amLODipine (NORVASC) 10 MG tablet Take 1 tablet by mouth daily.     celecoxib (CELEBREX) 100 MG capsule Take 100 mg by mouth 2 (two) times daily.     gabapentin (NEURONTIN) 100 MG capsule Take 100 mg by mouth 3 (three) times daily.     gabapentin (NEURONTIN) 800 MG tablet Take 800 mg by mouth 3 (three) times daily.     hydrochlorothiazide (HYDRODIURIL) 25 MG tablet Take 25 mg by mouth daily.     olmesartan (BENICAR) 20 MG tablet Take 20 mg by mouth daily.     omeprazole (PRILOSEC) 40 MG capsule Take 1 capsule (40 mg total) by mouth daily. 100 capsule 3   tiZANidine (ZANAFLEX) 2 MG tablet Take 2 mg by mouth at bedtime.     venlafaxine XR (EFFEXOR-XR) 75 MG 24 hr capsule Take 75 mg by mouth daily.     simvastatin (ZOCOR) 40 MG tablet Take 1 tablet (40 mg total) by mouth daily at 6 PM. 30 tablet 1   No facility-administered medications prior to visit.    PAST MEDICAL HISTORY: Past Medical History:  Diagnosis Date   Back pain    Chronic pain syndrome    takes Morphine daily   Constipation    takes Colace daily   Dizziness    occasionally   ED (erectile dysfunction)    takes Celexa daily   Gallbladder sludge    GERD (gastroesophageal reflux disease)    takes  Omeprazole daily   Glaucoma    mild per pt   Hyperlipidemia    takes Zocor nightly   Hypertension    takes Lisinopril daily   Inguinal hernia    Internal hemorrhoids    Lazy eye rt eye   Liver cyst    Neck pain    Neuropathy    Right hydrocele    Sleep disturbance    takes Ambien prn   Stroke (HCC)    03-15-2022   Ulcer     PAST SURGICAL HISTORY: Past Surgical History:  Procedure Laterality Date   BACK SURGERY     bilateral cataract removed     CERVICAL DISC SURGERY     x 2   COLONOSCOPY     HEMORRHOID SURGERY     INGUINAL HERNIA REPAIR Right 03/21/2013   Procedure: HERNIA REPAIR INGUINAL ADULT;  Surgeon: Wilmon Arms. Corliss Skains, MD;  Location: [facility] OR;  Service: General;  Laterality: Right;   INSERTION  OF MESH Right 03/21/2013   Procedure: INSERTION OF MESH;  Surgeon: Wilmon Arms. Corliss Skains, MD;  Location: [facility] OR;  Service: General;  Laterality: Right;   LOBECTOMY Left    cancerous   LOOP RECORDER INSERTION N/A 03/18/2022   Procedure: LOOP RECORDER INSERTION;  Surgeon: Lanier Prude, MD;  Location: [facility] INVASIVE CV [facility];  Service: Cardiovascular;  Laterality: N/A;   LUMBAR DISC SURGERY     x 2   NECK SURGERY      FAMILY HISTORY: Family History  Problem Relation Age of Onset   Diabetes Other    Hypertension Other    Stroke Other    Heart disease Other    COPD Other     SOCIAL HISTORY: Social History   Socioeconomic History   Marital status: Married    Spouse name: Not on file   Number of children: 1   Years of education: Not on file   Highest education level: Not on file  Occupational History   Occupation: retired    Associate Professor: UNEMPLOYED  Tobacco Use   Smoking status: Former    Packs/day: 2    Types: Cigarettes   Smokeless tobacco: Never   Tobacco comments:    quit in 1976  Vaping Use   Vaping Use: Never used  Substance and Sexual Activity   Alcohol use: No    Comment: quit in 1989   Drug use: No   Sexual activity: Yes  Other Topics Concern   Not on  file  Social History Narrative   Caffeine rare   Retired Production designer, 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  Generalized: Well developed, in no acute distress   Neurological examination  Mentation: Alert oriented to time, place, history taking. Follows all commands.  Mild trouble with fluidity of his speech. Cranial nerve II-XII: Pupils were equal round reactive to light. Extraocular movements were full, visual field were full on confrontational test. Facial sensation and strength were normal. Uvula tongue midline. Head turning and shoulder shrug  were normal and symmetric. Motor: The motor testing reveals 4/5 strength in BUE- patient reports d/t shoulders. 5/5 BLE. Good symmetric motor tone is noted throughout.  Sensory: Sensory testing is intact to soft touch on all 4 extremities. No evidence of extinction is noted.  Coordination: Cerebellar testing reveals good finger-nose-finger and heel-to-shin bilaterally.  Gait and station: Gait is normal.  Reflexes: Deep tendon reflexes are symmetric and normal bilaterally.   DIAGNOSTIC DATA (LABS, IMAGING, TESTING) - I reviewed patient records, labs, notes, testing and imaging myself where available.  Lab Results  Component Value Date   WBC 7.2 03/17/2022   HGB 12.2 (L) 03/17/2022   HCT 35.7 (L) 03/17/2022   MCV 92.0 03/17/2022   PLT 138 (L) 03/17/2022      Component Value Date/Time   NA 144 03/17/2022 0229   K 3.9 03/17/2022 0229   CL 113 (H) 03/17/2022 0229   CO2 22 03/17/2022 0229  GLUCOSE 96  03/17/2022 0229   GLUCOSE 85 04/22/2006 0827   BUN 18 03/17/2022 0229   CREATININE 1.21 03/17/2022 0229   CALCIUM 8.5 (L) 03/17/2022 0229   PROT 6.7 03/16/2022 0224   ALBUMIN 3.5 03/16/2022 0224   AST 26 03/16/2022 0224   ALT 26 03/16/2022 0224   ALKPHOS 64 03/16/2022 0224   BILITOT 1.4 (H) 03/16/2022 0224   GFRNONAA >60 03/17/2022 0229   GFRAA 70 (L) 03/13/2013 1027   Lab Results  Component Value Date   CHOL 161 03/16/2022   HDL 41 03/16/2022   LDLCALC 100 (H) 03/16/2022   TRIG 99 03/16/2022   CHOLHDL 3.9 03/16/2022   Lab Results  Component Value Date   HGBA1C 5.3 03/15/2022   No results found for: "VITAMINB12" Lab Results  Component Value Date   TSH 2.12 07/16/2014      ASSESSMENT AND PLAN 77 y.o. year old male  has a past medical history of Back pain, Chronic pain syndrome, Constipation, Dizziness, ED (erectile dysfunction), Gallbladder sludge, GERD (gastroesophageal reflux disease), Glaucoma, Hyperlipidemia, Hypertension, Inguinal hernia, Internal hemorrhoids, Lazy eye (rt eye), Liver cyst, Neck pain, Neuropathy, Right hydrocele, Sleep disturbance, Stroke (HCC), and Ulcer. here with   Stroke: Left MCA embolic infarct      Continue Plavix 75 mg daily for secondary stroke prevention.  Discussed secondary stroke prevention measures and importance of close PCP follow up for aggressive stroke risk factor management. I have gone over the pathophysiology of stroke, warning signs and symptoms, risk factors and their management in some detail with instructions to go to the closest emergency room for symptoms of concern. HTN: BP goal <130/90.  HLD: LDL goal <70. Recent LDL 100.  DMII: A1c goal<7.0. Recent A1c 5.3.  Encouraged patient to monitor diet and encouraged exercise Encouraged patient to continue doing speech therapy exercises at home.   FU with our office PRN    Butch Penny, MSN, NP-C 11/17/2022, 2:17 PM [facility] [address], [address] [place], [place] [address] [phone]

## 2022-11-19 NOTE — Progress Notes (Signed)
[facility] LOOP RECORDER 

## 2022-11-25 ENCOUNTER — Ambulatory Visit (INDEPENDENT_AMBULATORY_CARE_PROVIDER_SITE_OTHER): Payer: Medicare Other

## 2022-11-25 DIAGNOSIS — I639 Cerebral infarction, unspecified: Secondary | ICD-10-CM

## 2022-11-25 LAB — CUP PACEART REMOTE DEVICE CHECK
Date Time Interrogation Session: 20240620021625
Implantable Pulse Generator Implant Date: 20231012
Pulse Gen Serial Number: 511014813

## 2022-12-15 NOTE — Progress Notes (Signed)
[facility] Summary Report / Loop Recorder 

## 2022-12-27 ENCOUNTER — Ambulatory Visit (INDEPENDENT_AMBULATORY_CARE_PROVIDER_SITE_OTHER): Payer: Medicare Other

## 2022-12-27 DIAGNOSIS — I639 Cerebral infarction, unspecified: Secondary | ICD-10-CM | POA: Diagnosis not present

## 2022-12-27 LAB — CUP PACEART REMOTE DEVICE CHECK
Date Time Interrogation Session: 20240720063654
Implantable Pulse Generator Implant Date: 20231012
Pulse Gen Serial Number: 511014813

## 2023-01-06 NOTE — Progress Notes (Signed)
[facility] Loop Recorder  

## 2023-01-26 LAB — CUP PACEART REMOTE DEVICE CHECK
Date Time Interrogation Session: 20240821020946
Implantable Pulse Generator Implant Date: 20231012
Pulse Gen Serial Number: 511014813

## 2023-01-27 ENCOUNTER — Ambulatory Visit (INDEPENDENT_AMBULATORY_CARE_PROVIDER_SITE_OTHER): Payer: Medicare Other

## 2023-01-27 DIAGNOSIS — I639 Cerebral infarction, unspecified: Secondary | ICD-10-CM

## 2023-02-04 NOTE — Progress Notes (Signed)
[facility] Summary Report / Loop Recorder 

## 2023-04-28 ENCOUNTER — Ambulatory Visit (INDEPENDENT_AMBULATORY_CARE_PROVIDER_SITE_OTHER): Payer: Medicare Other

## 2023-04-28 DIAGNOSIS — I639 Cerebral infarction, unspecified: Secondary | ICD-10-CM | POA: Diagnosis not present

## 2023-05-17 LAB — CUP PACEART REMOTE DEVICE CHECK
Date Time Interrogation Session: 20241121233440
Implantable Pulse Generator Implant Date: 20231012
Pulse Gen Serial Number: 511014813

## 2023-05-23 NOTE — Progress Notes (Signed)
[facility] Summary Report / Loop Recorder 

## 2023-05-23 NOTE — Addendum Note (Signed)
Addended by: Elease Etienne A on: 05/23/2023 08:56 AM   Modules accepted: Orders

## 2023-06-29 ENCOUNTER — Ambulatory Visit: Payer: Medicare Other

## 2023-06-29 DIAGNOSIS — I639 Cerebral infarction, unspecified: Secondary | ICD-10-CM

## 2023-07-08 LAB — CUP PACEART REMOTE DEVICE CHECK
Date Time Interrogation Session: 20250124110606
Implantable Pulse Generator Implant Date: 20231012
Pulse Gen Serial Number: 511014813

## 2023-08-03 ENCOUNTER — Ambulatory Visit (INDEPENDENT_AMBULATORY_CARE_PROVIDER_SITE_OTHER): Payer: Medicare Other

## 2023-08-03 DIAGNOSIS — I639 Cerebral infarction, unspecified: Secondary | ICD-10-CM | POA: Diagnosis not present

## 2023-08-05 LAB — CUP PACEART REMOTE DEVICE CHECK
Date Time Interrogation Session: 20250226190029
Implantable Pulse Generator Implant Date: 20231012
Pulse Gen Model: 5000
Pulse Gen Serial Number: 511014813

## 2023-08-09 ENCOUNTER — Encounter: Payer: Self-pay | Admitting: Cardiology

## 2023-08-11 NOTE — Progress Notes (Signed)
 [facility] Summary Report / Loop Recorder

## 2023-08-11 NOTE — Addendum Note (Signed)
 Addended by: Elease Etienne A on: 08/11/2023 03:15 PM   Modules accepted: Orders

## 2023-09-05 LAB — CUP PACEART REMOTE DEVICE CHECK
Date Time Interrogation Session: 20250329020201
Implantable Pulse Generator Implant Date: 20231012
Pulse Gen Model: 5000
Pulse Gen Serial Number: 511014813

## 2023-09-06 NOTE — Addendum Note (Signed)
 Addended by: Elease Etienne A on: 09/06/2023 03:49 PM   Modules accepted: Orders

## 2023-09-06 NOTE — Progress Notes (Signed)
 [facility] Summary Report / Loop Recorder

## 2023-09-07 ENCOUNTER — Ambulatory Visit

## 2023-09-07 DIAGNOSIS — I639 Cerebral infarction, unspecified: Secondary | ICD-10-CM | POA: Diagnosis not present

## 2023-09-16 NOTE — Progress Notes (Signed)
 I agree with the above plan

## 2023-09-17 ENCOUNTER — Encounter: Payer: Self-pay | Admitting: Cardiology

## 2023-09-21 NOTE — Progress Notes (Signed)
 [facility] Summary Report / Loop Recorder

## 2023-10-12 ENCOUNTER — Ambulatory Visit (INDEPENDENT_AMBULATORY_CARE_PROVIDER_SITE_OTHER)

## 2023-10-12 DIAGNOSIS — I639 Cerebral infarction, unspecified: Secondary | ICD-10-CM

## 2023-10-14 LAB — CUP PACEART REMOTE DEVICE CHECK
Date Time Interrogation Session: 20250505090146
Implantable Pulse Generator Implant Date: 20231012
Pulse Gen Model: 5000
Pulse Gen Serial Number: 511014813

## 2023-10-16 ENCOUNTER — Encounter: Payer: Self-pay | Admitting: Cardiology

## 2023-11-10 ENCOUNTER — Ambulatory Visit (INDEPENDENT_AMBULATORY_CARE_PROVIDER_SITE_OTHER)

## 2023-11-10 DIAGNOSIS — I639 Cerebral infarction, unspecified: Secondary | ICD-10-CM

## 2023-11-10 LAB — CUP PACEART REMOTE DEVICE CHECK
Date Time Interrogation Session: 20250605024414
Implantable Pulse Generator Implant Date: 20231012
Pulse Gen Model: 5000
Pulse Gen Serial Number: 511014813

## 2023-11-14 ENCOUNTER — Ambulatory Visit: Payer: Self-pay | Admitting: Cardiology

## 2023-11-16 ENCOUNTER — Encounter

## 2023-11-21 NOTE — Addendum Note (Signed)
 Addended by: Lott Rouleau A on: 11/21/2023 02:25 PM   Modules accepted: Orders

## 2023-11-21 NOTE — Progress Notes (Signed)
 [facility] Summary Report / Loop Recorder

## 2023-12-12 ENCOUNTER — Ambulatory Visit

## 2023-12-12 DIAGNOSIS — I639 Cerebral infarction, unspecified: Secondary | ICD-10-CM | POA: Diagnosis not present

## 2023-12-12 LAB — CUP PACEART REMOTE DEVICE CHECK
Date Time Interrogation Session: 20250707050233
Implantable Pulse Generator Implant Date: 20231012
Pulse Gen Model: 5000
Pulse Gen Serial Number: 511014813

## 2023-12-17 ENCOUNTER — Ambulatory Visit: Payer: Self-pay | Admitting: Cardiology

## 2023-12-21 ENCOUNTER — Encounter

## 2023-12-27 NOTE — Progress Notes (Signed)
 [facility] Summary Report / Loop Recorder

## 2024-01-12 ENCOUNTER — Ambulatory Visit

## 2024-01-12 DIAGNOSIS — I639 Cerebral infarction, unspecified: Secondary | ICD-10-CM

## 2024-01-12 LAB — CUP PACEART REMOTE DEVICE CHECK
Date Time Interrogation Session: 20250807060513
Implantable Pulse Generator Implant Date: 20231012
Pulse Gen Model: 5000
Pulse Gen Serial Number: 511014813

## 2024-01-13 ENCOUNTER — Ambulatory Visit: Payer: Self-pay | Admitting: Cardiology

## 2024-01-25 ENCOUNTER — Encounter

## 2024-02-13 ENCOUNTER — Ambulatory Visit

## 2024-02-13 DIAGNOSIS — I639 Cerebral infarction, unspecified: Secondary | ICD-10-CM | POA: Diagnosis not present

## 2024-02-14 LAB — CUP PACEART REMOTE DEVICE CHECK
Date Time Interrogation Session: 20250907050421
Implantable Pulse Generator Implant Date: 20231012
Pulse Gen Model: 5000
Pulse Gen Serial Number: 511014813

## 2024-02-17 ENCOUNTER — Ambulatory Visit: Payer: Self-pay | Admitting: Cardiology

## 2024-02-23 NOTE — Progress Notes (Signed)
 Remote Loop Recorder Transmission

## 2024-03-02 NOTE — Progress Notes (Signed)
 Remote Loop Recorder Transmission

## 2024-03-15 ENCOUNTER — Ambulatory Visit (INDEPENDENT_AMBULATORY_CARE_PROVIDER_SITE_OTHER)

## 2024-03-15 DIAGNOSIS — I639 Cerebral infarction, unspecified: Secondary | ICD-10-CM | POA: Diagnosis not present

## 2024-03-15 LAB — CUP PACEART REMOTE DEVICE CHECK
Date Time Interrogation Session: 20251008070414
Implantable Pulse Generator Implant Date: 20231012
Pulse Gen Model: 5000
Pulse Gen Serial Number: 511014813

## 2024-03-19 ENCOUNTER — Ambulatory Visit: Payer: Self-pay | Admitting: Cardiology

## 2024-03-19 NOTE — Progress Notes (Signed)
 Remote Loop Recorder Transmission

## 2024-03-20 NOTE — Progress Notes (Signed)
 Remote Loop Recorder Transmission

## 2024-04-15 LAB — CUP PACEART REMOTE DEVICE CHECK
Date Time Interrogation Session: 20251108044246
Implantable Pulse Generator Implant Date: 20231012
Pulse Gen Model: 5000
Pulse Gen Serial Number: 511014813

## 2024-04-16 ENCOUNTER — Ambulatory Visit: Payer: Self-pay | Admitting: Cardiology

## 2024-04-16 ENCOUNTER — Ambulatory Visit

## 2024-04-16 DIAGNOSIS — I639 Cerebral infarction, unspecified: Secondary | ICD-10-CM

## 2024-04-18 NOTE — Progress Notes (Signed)
 Remote Loop Recorder Transmission

## 2024-05-17 ENCOUNTER — Ambulatory Visit

## 2024-05-17 LAB — CUP PACEART REMOTE DEVICE CHECK
Date Time Interrogation Session: 20251211050020
Implantable Pulse Generator Implant Date: 20231012
Pulse Gen Model: 5000
Pulse Gen Serial Number: 511014813

## 2024-05-20 ENCOUNTER — Ambulatory Visit: Payer: Self-pay | Admitting: Cardiology

## 2024-05-24 NOTE — Progress Notes (Signed)
 Remote Loop Recorder Transmission

## 2024-06-16 LAB — CUP PACEART REMOTE DEVICE CHECK
Date Time Interrogation Session: 20260110053532
Implantable Pulse Generator Implant Date: 20231012
Pulse Gen Model: 5000
Pulse Gen Serial Number: 511014813

## 2024-06-18 ENCOUNTER — Ambulatory Visit

## 2024-06-18 ENCOUNTER — Ambulatory Visit: Attending: Internal Medicine

## 2024-06-18 DIAGNOSIS — I639 Cerebral infarction, unspecified: Secondary | ICD-10-CM | POA: Diagnosis not present

## 2024-07-02 LAB — CUP PACEART REMOTE DEVICE CHECK
Date Time Interrogation Session: 20260125053532
Implantable Pulse Generator Implant Date: 20231012
Pulse Gen Model: 5000
Pulse Gen Serial Number: 511014813

## 2024-07-02 NOTE — Progress Notes (Signed)
 Remote Loop Recorder Transmission

## 2024-07-07 ENCOUNTER — Ambulatory Visit: Payer: Self-pay | Admitting: Student in an Organized Health Care Education/Training Program

## 2024-07-18 ENCOUNTER — Ambulatory Visit

## 2024-08-18 ENCOUNTER — Ambulatory Visit

## 2024-09-17 ENCOUNTER — Ambulatory Visit

## 2024-09-18 ENCOUNTER — Ambulatory Visit

## 2024-10-19 ENCOUNTER — Ambulatory Visit

## 2024-11-19 ENCOUNTER — Ambulatory Visit

## 2024-12-17 ENCOUNTER — Ambulatory Visit

## 2025-03-18 ENCOUNTER — Ambulatory Visit

## 2025-06-17 ENCOUNTER — Ambulatory Visit

## 2025-09-16 ENCOUNTER — Ambulatory Visit
